# Patient Record
Sex: Male | Born: 1950 | Race: White | Hispanic: No | Marital: Married | Smoking: Former smoker
Health system: Southern US, Community
[De-identification: ages and names within clinical notes are randomized; demographics above are authoritative.]

## PROBLEM LIST (undated history)

## (undated) DIAGNOSIS — C801 Malignant (primary) neoplasm, unspecified: Secondary | ICD-10-CM

## (undated) DIAGNOSIS — E109 Type 1 diabetes mellitus without complications: Secondary | ICD-10-CM

## (undated) DIAGNOSIS — F32A Depression, unspecified: Secondary | ICD-10-CM

## (undated) DIAGNOSIS — E119 Type 2 diabetes mellitus without complications: Secondary | ICD-10-CM

## (undated) DIAGNOSIS — F1011 Alcohol abuse, in remission: Secondary | ICD-10-CM

## (undated) DIAGNOSIS — F4310 Post-traumatic stress disorder, unspecified: Secondary | ICD-10-CM

## (undated) DIAGNOSIS — K55069 Acute infarction of intestine, part and extent unspecified: Secondary | ICD-10-CM

## (undated) DIAGNOSIS — F329 Major depressive disorder, single episode, unspecified: Secondary | ICD-10-CM

## (undated) DIAGNOSIS — F419 Anxiety disorder, unspecified: Secondary | ICD-10-CM

## (undated) HISTORY — PX: HERNIA REPAIR: SHX51

## (undated) HISTORY — PX: CHOLECYSTECTOMY: SHX55

## (undated) HISTORY — DX: Alcohol abuse, in remission: F10.11

## (undated) HISTORY — DX: Type 1 diabetes mellitus without complications: E10.9

## (undated) HISTORY — DX: Malignant (primary) neoplasm, unspecified: C80.1

## (undated) HISTORY — DX: Anxiety disorder, unspecified: F41.9

## (undated) HISTORY — PX: OTHER SURGICAL HISTORY: SHX169

## (undated) HISTORY — DX: Post-traumatic stress disorder, unspecified: F43.10

## (undated) HISTORY — DX: Depression, unspecified: F32.A

---
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
## 2017-01-11 DIAGNOSIS — E119 Type 2 diabetes mellitus without complications: Secondary | ICD-10-CM | POA: Insufficient documentation

## 2017-04-26 ENCOUNTER — Encounter (HOSPITAL_COMMUNITY): Payer: Self-pay | Admitting: Emergency Medicine

## 2017-04-26 ENCOUNTER — Emergency Department (HOSPITAL_COMMUNITY)
Admission: EM | Admit: 2017-04-26 | Discharge: 2017-04-26 | Disposition: A | Discharge status: Home / Self Care | Payer: Medicare PPO | Attending: Emergency Medicine | Admitting: Emergency Medicine

## 2017-04-26 ENCOUNTER — Other Ambulatory Visit: Payer: Self-pay

## 2017-04-26 DIAGNOSIS — E119 Type 2 diabetes mellitus without complications: Secondary | ICD-10-CM | POA: Diagnosis not present

## 2017-04-26 DIAGNOSIS — R791 Abnormal coagulation profile: Secondary | ICD-10-CM | POA: Insufficient documentation

## 2017-04-26 DIAGNOSIS — Z7901 Long term (current) use of anticoagulants: Secondary | ICD-10-CM | POA: Diagnosis not present

## 2017-04-26 DIAGNOSIS — Z79899 Other long term (current) drug therapy: Secondary | ICD-10-CM | POA: Insufficient documentation

## 2017-04-26 DIAGNOSIS — T45511A Poisoning by anticoagulants, accidental (unintentional), initial encounter: Secondary | ICD-10-CM

## 2017-04-26 DIAGNOSIS — F17210 Nicotine dependence, cigarettes, uncomplicated: Secondary | ICD-10-CM | POA: Diagnosis not present

## 2017-04-26 HISTORY — DX: Type 2 diabetes mellitus without complications: E11.9

## 2017-04-26 HISTORY — DX: Acute infarction of intestine, part and extent unspecified: K55.069

## 2017-04-26 LAB — BASIC METABOLIC PANEL
Anion gap: 13 (ref 5–15)
BUN: 9 mg/dL (ref 6–20)
CHLORIDE: 98 mmol/L — AB (ref 101–111)
CO2: 23 mmol/L (ref 22–32)
CREATININE: 0.87 mg/dL (ref 0.61–1.24)
Calcium: 9.8 mg/dL (ref 8.9–10.3)
GFR calc Af Amer: >60 mL/min (ref >60)
Glucose, Bld: 104 mg/dL — ABNORMAL HIGH (ref 65–99)
Potassium: 4.3 mmol/L (ref 3.5–5.1)
SODIUM: 134 mmol/L — AB (ref 135–145)

## 2017-04-26 LAB — CBC WITH DIFFERENTIAL/PLATELET
Basophils Absolute: 0 10*3/uL (ref 0.0–0.1)
Basophils Relative: 0 %
EOS ABS: 0.1 10*3/uL (ref 0.0–0.7)
Eosinophils Relative: 1 %
HCT: 35.5 % — ABNORMAL LOW (ref 39.0–52.0)
HEMOGLOBIN: 11.8 g/dL — AB (ref 13.0–17.0)
Lymphocytes Relative: 26 %
Lymphs Abs: 2.8 10*3/uL (ref 0.7–4.0)
MCH: 31.1 pg (ref 26.0–34.0)
MCHC: 33.2 g/dL (ref 30.0–36.0)
MCV: 93.4 fL (ref 78.0–100.0)
MONOS PCT: 7 %
Monocytes Absolute: 0.8 10*3/uL (ref 0.1–1.0)
NEUTROS PCT: 66 %
Neutro Abs: 7 10*3/uL (ref 1.7–7.7)
PLATELETS: 283 10*3/uL (ref 150–400)
RBC: 3.8 MIL/uL — ABNORMAL LOW (ref 4.22–5.81)
RDW: 13.9 % (ref 11.5–15.5)
WBC: 10.7 10*3/uL — ABNORMAL HIGH (ref 4.0–10.5)

## 2017-04-26 LAB — PROTIME-INR
INR: 9.97
PROTHROMBIN TIME: 79 s — AB (ref 11.4–15.2)

## 2017-04-26 LAB — POC OCCULT BLOOD, ED: FECAL OCCULT BLD: NEGATIVE

## 2017-04-26 NOTE — ED Triage Notes (Signed)
Patient states he is on warfarin and was called by his doctor's office stating his INR is 13.5.

## 2017-04-26 NOTE — ED Provider Notes (Addendum)
[facility] EMERGENCY DEPARTMENT Provider Note   CSN: [id number] Arrival date & time: 04/26/17  1300     History   Chief Complaint Chief Complaint  Patient presents with  . Abnormal Lab    HPI Grant Medina is a 67 y.o. male.  Told to come to the emergency department as he had routine INR drawn today and INR was 13.5.  He reports that he has been warfarin 10 mg daily for the past 1 week by accident.  10 mg tablets were delivered to his house.  He is supposed to be on 2-3 mg daily.  He is asymptomatic except for feeling "tired for the past few days" he denies pain anywhere denies lightheadedness denies blood per rectum or black stools.  No other associated symptoms  HPI  Past Medical History:  Diagnosis Date  . Diabetes mellitus without complication ([place])   . 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   Past Surgical History:  Procedure Laterality Date  . HERNIA REPAIR    . reconstructed bile duct         Home Medications    Prior to Admission medications   Medication Sig Start Date End Date Taking? Authorizing Provider  acetaminophen (TYLENOL) 500 MG tablet  01/21/17   [provider]  ALPRAZolam Duanne Moron) 0.5 MG tablet  03/30/17   [provider]  baclofen (LIORESAL) 10 MG tablet  02/01/17   [provider]  lisinopril (PRINIVIL,ZESTRIL) 10 MG tablet  04/23/17   [provider]  metFORMIN (GLUCOPHAGE) 1000 MG tablet  04/23/17   [provider]  Multiple Vitamins-Minerals (CENTRUM ADULTS PO)  01/21/17   [provider]  polyethylene glycol powder (GLYCOLAX/MIRALAX) powder  04/03/17   [provider]  tamsulosin (FLOMAX) 0.4 MG CAPS capsule  04/03/17   [provider]  warfarin (COUMADIN) 10 MG tablet  04/19/17   [provider]  warfarin (COUMADIN) 2 MG tablet  03/27/17   [provider]  warfarin (COUMADIN) 3 MG tablet  02/27/17    [provider]  zolpidem (AMBIEN) 10 MG tablet  03/30/17   [provider]    Family History No family history on file.  Social History Social History   Tobacco Use  . Smoking status: Current Every Day Smoker    Types: E-cigarettes  . Smokeless tobacco: Never Used  Substance Use Topics  . Alcohol use: No    Frequency: Never  . Drug use: Not on file     Allergies   Patient has no known allergies.   Review of Systems Review of Systems  Constitutional: Positive for fatigue.  HENT: Negative.   Respiratory: Negative.   Cardiovascular: Positive for chest pain.       Syncope  Gastrointestinal: Negative.   Musculoskeletal: Negative.   Skin: Negative.   Allergic/Immunologic: Negative.   Neurological: Negative.   Hematological: Bruises/bleeds easily.  Psychiatric/Behavioral: Negative.   All other systems reviewed and are negative.    Physical Exam Updated Vital Signs BP (!) 140/92 (BP Location: Right Arm)   Pulse (!) 104   Temp 98.5 F (36.9 C) (Oral)   Resp 20   Ht 5\' 7"  (1.702 m)   Wt 74.8 kg (165 lb)   SpO2 100%   BMI 25.84 kg/m   Physical Exam  Constitutional: He appears well-developed and well-nourished.  HENT:  Head: Normocephalic and atraumatic.  Eyes: Conjunctivae are normal. Pupils are equal,  round, and reactive to light.  Neck: Neck supple. No tracheal deviation present. No thyromegaly present.  Cardiovascular: Normal rate and regular rhythm.  No murmur heard. Pulse counted at 68 bpm by me  Pulmonary/Chest: Effort normal and breath sounds normal.  Abdominal: Soft. Bowel sounds are normal. He exhibits no distension. There is no tenderness.  Genitourinary: Rectum normal. Rectal exam shows guaiac negative stool.  Genitourinary Comments: Normal tone brown stool nontender no gross blood  Musculoskeletal: Normal range of motion. He exhibits no edema or tenderness.  Neurological: He is alert. Coordination normal.  Skin: Skin is warm  and dry. Capillary refill takes less than 2 seconds. No rash noted.  Psychiatric: He has a normal mood and affect.  Nursing note and vitals reviewed.    ED Treatments / Results  Labs (all labs ordered are listed, but only abnormal results are displayed) Labs Reviewed  CBC WITH DIFFERENTIAL/PLATELET - Abnormal; Notable for the following components:      Result Value   WBC 10.7 (*)    RBC 3.80 (*)    Hemoglobin 11.8 (*)    HCT 35.5 (*)    All other components within normal limits  PROTIME-INR  BASIC METABOLIC PANEL  POC OCCULT BLOOD, ED   Results for orders placed or performed during the hospital encounter of 04/26/17  Protime-INR  Result Value Ref Range   Prothrombin Time 79.0 (H) 11.4 - 15.2 seconds   INR 9.97 (HH)   CBC with Differential  Result Value Ref Range   WBC 10.7 (H) 4.0 - 10.5 K/uL   RBC 3.80 (L) 4.22 - 5.81 MIL/uL   Hemoglobin 11.8 (L) 13.0 - 17.0 g/dL   HCT 35.5 (L) 39.0 - 52.0 %   MCV 93.4 78.0 - 100.0 fL   MCH 31.1 26.0 - 34.0 pg   MCHC 33.2 30.0 - 36.0 g/dL   RDW 13.9 11.5 - 15.5 %   Platelets 283 150 - 400 K/uL   Neutrophils Relative % 66 %   Neutro Abs 7.0 1.7 - 7.7 K/uL   Lymphocytes Relative 26 %   Lymphs Abs 2.8 0.7 - 4.0 K/uL   Monocytes Relative 7 %   Monocytes Absolute 0.8 0.1 - 1.0 K/uL   Eosinophils Relative 1 %   Eosinophils Absolute 0.1 0.0 - 0.7 K/uL   Basophils Relative 0 %   Basophils Absolute 0.0 0.0 - 0.1 K/uL  Basic metabolic panel  Result Value Ref Range   Sodium 134 (L) 135 - 145 mmol/L   Potassium 4.3 3.5 - 5.1 mmol/L   Chloride 98 (L) 101 - 111 mmol/L   CO2 23 22 - 32 mmol/L   Glucose, Bld 104 (H) 65 - 99 mg/dL   BUN 9 6 - 20 mg/dL   Creatinine, Ser 0.87 0.61 - 1.24 mg/dL   Calcium 9.8 8.9 - 10.3 mg/dL   GFR calc non Af Amer >60 >60 mL/min   GFR calc Af Amer >60 >60 mL/min   Anion gap 13 5 - 15  POC occult blood, ED  Result Value Ref Range   Fecal Occult Bld NEGATIVE NEGATIVE   No results found. EKG  EKG  Interpretation None       Radiology No results found.  Procedures Procedures (including critical care time)  Medications Ordered in ED Medications - No data to display   Initial Impression / Assessment and Plan / ED Course  I have reviewed the triage vital signs and the nursing notes.  Pertinent labs & imaging  results that were available during my care of the patient were reviewed by me and considered in my medical decision making (see chart for details).     3 35 p.m. patient resting comfortably.  No distress.  Case discussed with hospital pharmacist.  No need for further treatment.  Withhold Coumadin for 2 days.  INR recheck 2 days. She reports that he will be able to get his INR rechecked at the [facility] Final Clinical Impressions(s) / ED Diagnoses  Diagnosis warfarin toxicity Final diagnoses:  None    ED Discharge Orders    None       Orlie Dakin, MD 04/26/17 1541    Orlie Dakin, MD 04/26/17 1547

## 2017-04-26 NOTE — Discharge Instructions (Signed)
Your INR today is 9.97.  Do not take any Coumadin (warfarin) today or tomorrow.  Get your INR rechecked again in 2 days and ask your doctor after you get the result when to restart Coumadin.  Return if you develop headache blood in stool black stools or lightheadedness or if your condition worsens for any reason.avoid  falls.

## 2017-04-26 NOTE — ED Notes (Addendum)
CRITICAL VALUE ALERT  Critical Value:  PT 79/INR 9.97  Date & Time Notied:  04/26/17 1520  Provider Notified: dr Carney Bern  Orders Received/Actions taken:

## 2018-03-30 ENCOUNTER — Encounter (HOSPITAL_COMMUNITY): Payer: Self-pay | Admitting: Emergency Medicine

## 2018-03-30 ENCOUNTER — Emergency Department (HOSPITAL_COMMUNITY)
Admission: EM | Admit: 2018-03-30 | Discharge: 2018-03-31 | Disposition: A | Discharge status: Home / Self Care | Payer: Medicare PPO | Attending: Emergency Medicine | Admitting: Emergency Medicine

## 2018-03-30 ENCOUNTER — Other Ambulatory Visit: Payer: Self-pay

## 2018-03-30 DIAGNOSIS — M545 Low back pain, unspecified: Secondary | ICD-10-CM

## 2018-03-30 DIAGNOSIS — M5441 Lumbago with sciatica, right side: Secondary | ICD-10-CM | POA: Diagnosis not present

## 2018-03-30 DIAGNOSIS — M5431 Sciatica, right side: Secondary | ICD-10-CM

## 2018-03-30 DIAGNOSIS — Z79899 Other long term (current) drug therapy: Secondary | ICD-10-CM | POA: Insufficient documentation

## 2018-03-30 DIAGNOSIS — F17290 Nicotine dependence, other tobacco product, uncomplicated: Secondary | ICD-10-CM | POA: Diagnosis not present

## 2018-03-30 DIAGNOSIS — E119 Type 2 diabetes mellitus without complications: Secondary | ICD-10-CM | POA: Diagnosis not present

## 2018-03-30 DIAGNOSIS — Z794 Long term (current) use of insulin: Secondary | ICD-10-CM | POA: Diagnosis not present

## 2018-03-30 MED ORDER — HYDROCODONE-ACETAMINOPHEN 5-325 MG PO TABS
2.0000 | ORAL_TABLET | Freq: Once | ORAL | Status: AC
  Administered 2018-03-31: 2 via ORAL
  Filled 2018-03-30: qty 2

## 2018-03-30 NOTE — ED Triage Notes (Signed)
Pt c/o pain to right hip/buttock area that radiates down his right thigh since yesterday, denies injury

## 2018-03-31 ENCOUNTER — Emergency Department (HOSPITAL_COMMUNITY): Payer: Medicare PPO

## 2018-03-31 MED ORDER — METHOCARBAMOL 500 MG PO TABS: 500.0000 mg | ORAL_TABLET | Freq: Two times a day (BID) | ORAL | 0 refills | Status: AC

## 2018-03-31 MED ORDER — HYDROCODONE-ACETAMINOPHEN 5-325 MG PO TABS: 1.0000 | ORAL_TABLET | ORAL | 0 refills | Status: DC | PRN

## 2018-03-31 NOTE — ED Provider Notes (Signed)
[facility] EMERGENCY DEPARTMENT Provider Note   CSN: [id number] Arrival date & time: 03/30/18  2253     History   Chief Complaint Chief Complaint  Patient presents with  . Back Pain    HPI Grant Medina is a 67 y.o. male.  The history is provided by the patient. No language interpreter was used.  Back Pain   This is a new problem. The current episode started 2 days ago. The problem occurs constantly. The problem has been gradually worsening. The pain is associated with no known injury. The pain is moderate. The pain is worse during the day. He has tried nothing for the symptoms. The treatment provided no relief.   Pt complains of pain in his right low back/buttock area that shots down his right leg.  Pt reports he put a neighbors pain patch on the area.  Past Medical History:  Diagnosis Date  . Diabetes mellitus without complication ([place])   . 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   Past Surgical History:  Procedure Laterality Date  . HERNIA REPAIR    . reconstructed bile duct          Home Medications    Prior to Admission medications   Medication Sig Start Date End Date Taking? Authorizing Provider  acetaminophen (TYLENOL) 500 MG tablet Take 500 mg by mouth every 4 (four) hours as needed.  01/21/17   [provider]  ALPRAZolam Duanne Moron) 1 MG tablet Take 1 mg by mouth 3 (three) times daily.  03/30/17   [provider]  B Complex-Biotin-FA (B-COMPLEX PO) Take 1 tablet by mouth daily.    [provider]  FLUoxetine (PROZAC) 10 MG capsule Take 10 mg by mouth daily.    [provider]  gabapentin (NEURONTIN) 600 MG tablet Take 600 mg by mouth 3 (three) times daily.    [provider]  Insulin Degludec (TRESIBA FLEXTOUCH [place]) Inject 16 Units into the skin daily. 16 units daily, every 3rd day 20 units    [provider]  lipase/protease/amylase (CREON) 12000 units  CPEP capsule Take 36,000 Units by mouth 3 (three) times daily before meals.    [provider]  lisinopril (PRINIVIL,ZESTRIL) 10 MG tablet Take 10 mg by mouth daily.  04/23/17   [provider]  metFORMIN (GLUCOPHAGE) 1000 MG tablet Take 1,000 mg by mouth 2 (two) times daily with a meal.  04/23/17   [provider]  mirtazapine (REMERON) 15 MG tablet Take 15 mg by mouth at bedtime.    [provider]  Multiple Vitamin (DAILY VITE PO) Take 1 tablet by mouth daily.    [provider]  Multiple Vitamins-Minerals (CENTRUM ADULTS PO)  01/21/17   [provider]  naltrexone (DEPADE) 50 MG tablet Take 50 mg by mouth daily.    [provider]  polyethylene glycol powder (GLYCOLAX/MIRALAX) powder  04/03/17   [provider]  warfarin (COUMADIN) 10 MG tablet Take 10 mg by mouth one time only at 6 PM. Take 2mg  tablet on all days except for Thursday, Saturday, Sunday take 3mg  tablet. 04/19/17   [provider]  warfarin (COUMADIN) 2 MG tablet  03/27/17   [provider]  warfarin (COUMADIN) 3 MG tablet  02/27/17   [provider]  zolpidem (AMBIEN) 10 MG tablet Take 10 mg by mouth at bedtime.  03/30/17   [provider]    Family History  History reviewed. No pertinent family history.  Social History Social History   Tobacco Use  . Smoking status: Current Every Day Smoker    Types: E-cigarettes  . Smokeless tobacco: Never Used  Substance Use Topics  . Alcohol use: No    Frequency: Never  . Drug use: Never     Allergies   Patient has no known allergies.   Review of Systems Review of Systems  Musculoskeletal: Positive for back pain.  All other systems reviewed and are negative.    Physical Exam Updated Vital Signs BP 138/79 (BP Location: Left Arm)   Pulse 61   Temp 97.9 F (36.6 C) (Oral)   Resp 16   Ht 5\' 7"  (1.702 m)   Wt 77.1 kg   SpO2 98%   BMI 26.63 kg/m   Physical Exam   Constitutional: He appears well-developed and well-nourished.  Cardiovascular: Normal rate.  Pulmonary/Chest: Effort normal.  Musculoskeletal: Normal range of motion.  Tender sciatic area no rash   Neurological: He is alert.  Skin: Skin is warm.  Nursing note and vitals reviewed.    ED Treatments / Results  Labs (all labs ordered are listed, but only abnormal results are displayed) Labs Reviewed - No data to display  EKG None  Radiology No results found.  Procedures Procedures (including critical care time)  Medications Ordered in ED Medications  HYDROcodone-acetaminophen (NORCO/VICODIN) 5-325 MG per tablet 2 tablet (2 tablets Oral Given 03/31/18 0011)     Initial Impression / Assessment and Plan / ED Course  I have reviewed the triage vital signs and the nursing notes.  Pertinent labs & imaging results that were available during my care of the patient were reviewed by me and considered in my medical decision making (see chart for details).     MDM  Pain is in sciatic area and goes down leg.  No abdominal pain, no mass.  LS spine no acute abnormality.  Pt given hydrocodone 2 tablets.  I advised pt to look for rash that could indicate shingles.  Pt given rx for robaxin and hydrocodone   Final Clinical Impressions(s) / ED Diagnoses   Final diagnoses:  Sciatica of right side  Acute low back pain, unspecified back pain laterality, unspecified whether sciatica present    ED Discharge Orders         Ordered    HYDROcodone-acetaminophen (NORCO/VICODIN) 5-325 MG tablet  Every 4 hours PRN     03/31/18 0103    methocarbamol (ROBAXIN) 500 MG tablet  2 times daily     03/31/18 0103        An After Visit Summary was printed and given to the patient.    Fransico Meadow, PA-C 03/31/18 Kerrin Champagne    Rolland Porter, MD 03/31/18 [phone]

## 2018-03-31 NOTE — Discharge Instructions (Addendum)
Return if any problems.

## 2018-10-30 ENCOUNTER — Ambulatory Visit: Payer: Medicare PPO | Admitting: Physician Assistant

## 2018-10-31 ENCOUNTER — Other Ambulatory Visit: Payer: Self-pay

## 2018-10-31 ENCOUNTER — Ambulatory Visit (INDEPENDENT_AMBULATORY_CARE_PROVIDER_SITE_OTHER): Payer: Medicare PPO | Admitting: Physician Assistant

## 2018-10-31 ENCOUNTER — Encounter: Payer: Self-pay | Admitting: Physician Assistant

## 2018-10-31 VITALS — BP 141/73 | HR 98 | Ht 67.0 in | Wt 169.0 lb

## 2018-10-31 DIAGNOSIS — F411 Generalized anxiety disorder: Secondary | ICD-10-CM | POA: Diagnosis not present

## 2018-10-31 DIAGNOSIS — F331 Major depressive disorder, recurrent, moderate: Secondary | ICD-10-CM

## 2018-10-31 DIAGNOSIS — G4700 Insomnia, unspecified: Secondary | ICD-10-CM | POA: Diagnosis not present

## 2018-10-31 DIAGNOSIS — F4310 Post-traumatic stress disorder, unspecified: Secondary | ICD-10-CM | POA: Diagnosis not present

## 2018-10-31 MED ORDER — TRAZODONE HCL 50 MG PO TABS: 25.0000 mg | ORAL_TABLET | Freq: Every evening | ORAL | 1 refills | Status: DC | PRN

## 2018-10-31 MED ORDER — FLUOXETINE HCL 40 MG PO CAPS: 40.0000 mg | ORAL_CAPSULE | Freq: Every day | ORAL | 1 refills | Status: DC

## 2018-10-31 MED ORDER — ALPRAZOLAM 0.5 MG PO TABS: 0.2500 mg | ORAL_TABLET | Freq: Two times a day (BID) | ORAL | 1 refills | Status: DC | PRN
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
## 2018-11-01 ENCOUNTER — Encounter: Payer: Self-pay | Admitting: Physician Assistant

## 2018-11-13 ENCOUNTER — Other Ambulatory Visit: Payer: Self-pay

## 2018-11-13 ENCOUNTER — Ambulatory Visit (INDEPENDENT_AMBULATORY_CARE_PROVIDER_SITE_OTHER): Payer: Medicare PPO | Admitting: Psychiatry

## 2018-11-13 DIAGNOSIS — F4310 Post-traumatic stress disorder, unspecified: Secondary | ICD-10-CM | POA: Diagnosis not present

## 2018-11-13 DIAGNOSIS — F1011 Alcohol abuse, in remission: Secondary | ICD-10-CM

## 2018-11-13 DIAGNOSIS — F411 Generalized anxiety disorder: Secondary | ICD-10-CM | POA: Diagnosis not present

## 2018-11-13 DIAGNOSIS — Z87898 Personal history of other specified conditions: Secondary | ICD-10-CM

## 2018-11-13 DIAGNOSIS — G4700 Insomnia, unspecified: Secondary | ICD-10-CM | POA: Diagnosis not present
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
## 2018-11-22 ENCOUNTER — Ambulatory Visit (INDEPENDENT_AMBULATORY_CARE_PROVIDER_SITE_OTHER): Payer: Medicare PPO | Admitting: Psychiatry

## 2018-11-22 ENCOUNTER — Other Ambulatory Visit: Payer: Self-pay

## 2018-11-22 DIAGNOSIS — F411 Generalized anxiety disorder: Secondary | ICD-10-CM | POA: Diagnosis not present

## 2018-11-22 DIAGNOSIS — F1011 Alcohol abuse, in remission: Secondary | ICD-10-CM | POA: Diagnosis not present

## 2018-11-22 DIAGNOSIS — Z87898 Personal history of other specified conditions: Secondary | ICD-10-CM

## 2018-11-22 DIAGNOSIS — G4700 Insomnia, unspecified: Secondary | ICD-10-CM | POA: Diagnosis not present

## 2018-11-22 NOTE — Progress Notes (Signed)
Psychotherapy Progress Note [facility] [name], PhD LP  Patient ID: Grant Medina     MRN: [id number]     Therapy format: Family therapy w/ patient -- accompanied by wife [name] Date: 11/22/2018     Start: 3:14p Stop: 4:03p Time Spent: 49 min Location: in-person   Session narrative (presenting needs, interim history, self-report of stressors and symptoms, applications of prior therapy, status changes, and interventions made in session) Feeling better now that move is done, not much irritability.  Back into a more comfortable space after 2 years in a mill house near [place], often bothered by noise.  Trazodone only getting 4-5 hours sleep before waking.  Remeron may be stimulating evening carb loading.  Intended bedtime about 8pm, rises 5-5:30am.  History of long shifts and very early rising (e.g., 1:30am).  Has not ordered amber glasses.  Refreshed rationale and likely benefit.  Taught diaphragmatic breathing with imagery (deflating the air mattress, blowing away smoke), to good effect and oriented to practice and common obstacles.  Reports widespread arthritis, aggravated by moving a couple weeks ago, helped with steroid shot and antiinflammatory therapy.  Advised going forward that he might pre-medicate with OTC antiinflammatory to prevent flareups, subject to physician's judgment..  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  Therapeutic modalities: Cognitive Behavioral Therapy  Mental Status/Observations:  Appearance:   Casual     Behavior:  Appropriate  Motor:  Normal  Speech/Language:   Clear and Coherent  Affect:  Appropriate  Mood:  less irritable  Thought process:  normal  Thought content:    WNL  Sensory/Perceptual disturbances:    WNL  Orientation:  grossly intact  Attention:  Good  Concentration:  Good  Memory:  WNL  Insight:    Fair   Judgment:   Good  Impulse Control:  Good   Risk Assessment: Danger to Self: No Self-injurious Behavior: No Danger to Others: No Physical Aggression / Violence: No Duty to Warn: No Access to Firearms a concern: No  Assessment of progress:  progressing  Diagnosis:   ICD-10-CM   1. Insomnia, unspecified type  G47.00   2. Generalized anxiety disorder  F41.1   3. 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 Option to move bedtime to 9pm for a better fit to the night . Go ahead and order amber glasses -- use for at least an hour before bed . Practice breathing technique PRN for self-soothing, tension release . Other recommendations/advice as noted above . Continue to utilize previously learned skills ad lib . Maintain medication as prescribed and work faithfully with relevant prescriber(s) if any changes are desired or seem indicated . Call the clinic on-call service, present to ER, or call 911 if any life-threatening psychiatric crisis Return in about 2 weeks (around 12/06/2018).  Blanchie Serve, PhD [name], PhD LP Clinical Psychologist, [facility] [address] [place], [place] [address] [phone]

## 2018-12-12 ENCOUNTER — Encounter: Payer: Self-pay | Admitting: Physician Assistant

## 2018-12-12 ENCOUNTER — Ambulatory Visit (INDEPENDENT_AMBULATORY_CARE_PROVIDER_SITE_OTHER): Payer: Medicare PPO | Admitting: Physician Assistant

## 2018-12-12 ENCOUNTER — Other Ambulatory Visit: Payer: Self-pay

## 2018-12-12 ENCOUNTER — Ambulatory Visit (INDEPENDENT_AMBULATORY_CARE_PROVIDER_SITE_OTHER): Payer: Medicare PPO | Admitting: Psychiatry

## 2018-12-12 DIAGNOSIS — F331 Major depressive disorder, recurrent, moderate: Secondary | ICD-10-CM | POA: Diagnosis not present

## 2018-12-12 DIAGNOSIS — G4700 Insomnia, unspecified: Secondary | ICD-10-CM | POA: Diagnosis not present

## 2018-12-12 DIAGNOSIS — F3341 Major depressive disorder, recurrent, in partial remission: Secondary | ICD-10-CM

## 2018-12-12 DIAGNOSIS — F411 Generalized anxiety disorder: Secondary | ICD-10-CM | POA: Diagnosis not present

## 2018-12-12 DIAGNOSIS — F4310 Post-traumatic stress disorder, unspecified: Secondary | ICD-10-CM

## 2018-12-12 DIAGNOSIS — Z87898 Personal history of other specified conditions: Secondary | ICD-10-CM

## 2018-12-12 DIAGNOSIS — F1011 Alcohol abuse, in remission: Secondary | ICD-10-CM

## 2018-12-12 MED ORDER — ALPRAZOLAM 0.5 MG PO TABS: 0.2500 mg | ORAL_TABLET | Freq: Two times a day (BID) | ORAL | 2 refills | Status: DC | PRN

## 2018-12-12 MED ORDER — TRAZODONE HCL 50 MG PO TABS: 25.0000 mg | ORAL_TABLET | Freq: Every evening | ORAL | 0 refills | Status: DC | PRN

## 2018-12-12 NOTE — Progress Notes (Signed)
[facility]  Patient ID: Grant Medina,  MRN: [id number]  PCP: Esperanza Sheets, FNP  Date of Evaluation: 12/12/2018 Time spent:15 minutes  Chief Complaint:  Chief Complaint    Follow-up     Virtual Visit via Telephone Note  I connected with patient by a video enabled telemedicine application or telephone, with their informed consent, and verified patient privacy and that I am speaking with the correct person using two identifiers.  I am private, in my home and the patient is home.  I discussed the limitations, risks, security and privacy concerns of performing an evaluation and management service by telephone and the availability of in person appointments. I also discussed with the patient that there may be a patient responsible charge related to this service. The patient expressed understanding and agreed to proceed.   I discussed the assessment and treatment plan with the patient. The patient was provided an opportunity to ask questions and all were answered. The patient agreed with the plan and demonstrated an understanding of the instructions.   The patient was advised to call back or seek an in-person evaluation if the symptoms worsen or if the condition fails to improve as anticipated.  I provided 15  minutes of non-face-to-face time during this encounter.  HISTORY/CURRENT STATUS: HPI for routine med check.  He was last seen 10/31/2018.  At that time we increased the Prozac to 40 mg and restarted Xanax as needed and started trazodone.  He states that he is doing much better overall.  He sleeps well with the trazodone.  He usually only takes 1 pill but sometimes has needed two.  He is also using Amber colored glasses which helps relax him so that he can ready himself for sleep.  Anxiety is much better.  He does take the Xanax which is helpful.  Increasing the Prozac has also been helpful.  Patient denies loss of interest in usual activities and is able to enjoy  things.  Denies decreased energy or motivation.  Appetite has not changed.  No extreme sadness, tearfulness, or feelings of hopelessness.  Denies any changes in concentration, making decisions or remembering things.  Denies suicidal or homicidal thoughts.  Denies dizziness, syncope, seizures, numbness, tingling, tremor, tics, unsteady gait, slurred speech, confusion. Denies muscle or joint pain, stiffness, or dystonia.  Individual Medical History/ Review of Systems: Changes? :Yes He was having extreme fatigue associated with nausea not long ago.  He was tested for COVID which was negative.  He found out his blood sugars were elevated and Actos was added.  He has felt a lot better since his blood sugars are better controlled now.  Past medications for mental health diagnoses include: Xanax, Prozac, Ambien, Effexor, Wellbutrin  Allergies: Patient has no known allergies.  Current Medications:  Current Outpatient Medications:  .  acetaminophen (TYLENOL) 500 MG tablet, Take 500 mg by mouth every 4 (four) hours as needed. , Disp: , Rfl:  .  ALPRAZolam (XANAX) 0.5 MG tablet, Take 0.5-1 tablets (0.25-0.5 mg total) by mouth 2 (two) times daily as needed for anxiety., Disp: 60 tablet, Rfl: 2 .  B Complex-Biotin-FA (B-COMPLEX PO), Take 1 tablet by mouth daily., Disp: , Rfl:  .  FLUoxetine (PROZAC) 40 MG capsule, Take 1 capsule (40 mg total) by mouth daily., Disp: 30 capsule, Rfl: 1 .  gabapentin (NEURONTIN) 600 MG tablet, Take 600 mg by mouth 3 (three) times daily., Disp: , Rfl:  .  Insulin Degludec (TRESIBA FLEXTOUCH ), Inject 16  Units into the skin daily. 16 units daily, every 3rd day 20 units, Disp: , Rfl:  .  lipase/protease/amylase (CREON) 12000 units CPEP capsule, Take 36,000 Units by mouth 3 (three) times daily before meals., Disp: , Rfl:  .  lisinopril (PRINIVIL,ZESTRIL) 10 MG tablet, Take 10 mg by mouth daily. , Disp: , Rfl:  .  metFORMIN (GLUCOPHAGE) 1000 MG tablet, Take 1,000 mg by mouth 2  (two) times daily with a meal. , Disp: , Rfl:  .  mirtazapine (REMERON) 15 MG tablet, Take 15 mg by mouth at bedtime., Disp: , Rfl:  .  Multiple Vitamin (DAILY VITE PO), Take 1 tablet by mouth daily., Disp: , Rfl:  .  Multiple Vitamins-Minerals (CENTRUM ADULTS PO), , Disp: , Rfl:  .  naltrexone (DEPADE) 50 MG tablet, Take 50 mg by mouth daily., Disp: , Rfl:  .  polyethylene glycol powder (GLYCOLAX/MIRALAX) powder, , Disp: , Rfl:  .  rivaroxaban (XARELTO) 20 MG TABS tablet, Take by mouth., Disp: , Rfl:  .  traZODone (DESYREL) 50 MG tablet, Take 0.5-2 tablets (25-100 mg total) by mouth at bedtime as needed for sleep., Disp: 180 tablet, Rfl: 0 .  HYDROcodone-acetaminophen (NORCO/VICODIN) 5-325 MG tablet, Take 1 tablet by mouth every 4 (four) hours as needed. (Patient not taking: Reported on 10/31/2018), Disp: 10 tablet, Rfl: 0 .  methocarbamol (ROBAXIN) 500 MG tablet, Take 1 tablet (500 mg total) by mouth 2 (two) times daily. (Patient not taking: Reported on 10/31/2018), Disp: 20 tablet, Rfl: 0 .  ondansetron (ZOFRAN) 4 MG tablet, TK 1 T PO TID FOR 10 DAYS, Disp: , Rfl:  .  pioglitazone (ACTOS) 15 MG tablet, TK 1 T PO QD, Disp: , Rfl:  .  tamsulosin (FLOMAX) 0.4 MG CAPS capsule, , Disp: , Rfl:  .  warfarin (COUMADIN) 10 MG tablet, Take 10 mg by mouth one time only at 6 PM. Take 2mg  tablet on all days except for Thursday, Saturday, Sunday take 3mg  tablet., Disp: , Rfl:  .  warfarin (COUMADIN) 2 MG tablet, , Disp: , Rfl:  .  warfarin (COUMADIN) 3 MG tablet, , Disp: , Rfl:  Medication Side Effects: none  Family Medical/ Social History: Changes? No  MENTAL HEALTH EXAM:  There were no vitals taken for this visit.There is no height or weight on file to calculate BMI.  General Appearance: unable to assess  Eye Contact:  unable toassess  Speech:  Clear and Coherent  Volume:  Normal  Mood:  Euthymic  Affect:  Unable to assess  Thought Process:  Goal Directed  Orientation:  Full (Time, Place, and  Person)  Thought Content: Logical   Suicidal Thoughts:  No  Homicidal Thoughts:  No  Memory:  WNL  Judgement:  Good  Insight:  Good  Psychomotor Activity:  Unable to assess  Concentration:  Concentration: Good  Recall:  Good  Fund of Knowledge: Good  Language: Good  Assets:  Desire for Improvement  ADL's:  Intact  Cognition: WNL  Prognosis:  Good    DIAGNOSES:    ICD-10-CM   1. PTSD (post-traumatic stress disorder)  F43.10   2. Major depressive disorder, recurrent episode, moderate (HCC)  F33.1   3. Generalized anxiety disorder  F41.1   4. Insomnia, unspecified type  G47.00     Receiving Psychotherapy: Yes With Dr. Jonni Sanger Mitchum   RECOMMENDATIONS:  I am glad to see him doing so well! PDMP was reviewed. Continue Xanax 0.5 mg 1/2-1 p.o. twice daily as needed. Continue Prozac  40 mg daily. Continue gabapentin 600 mg 1 3 times daily. Continue mirtazapine 15 mg nightly. Continue trazodone 50 mg, 1-2 nightly as needed sleep. Continue psychotherapy with Dr. [name]. Return in 3 months.  Donnal Moat, PA-C   This record has been created using [employer].  Chart creation errors have been sought, but may not always have been located and corrected. Such creation errors do not reflect on the standard of medical care.

## 2018-12-12 NOTE — Progress Notes (Signed)
Psychotherapy Progress Note [facility] [name], PhD LP  Patient ID: Grant Medina     MRN: [id number]     Therapy format: Individual psychotherapy Date: 12/12/2018     Start: 3:25p Stop: 4:05p Time Spent: 40 min Location: telehealth   Telehealth visit -- I connected with this patient by an approved telecommunication method (audio only), with his informed consent, and verifying identity and patient privacy.  I was located at my office and patient at his home.  As needed, we discussed the limitations, risks, and security and privacy concerns associated with telehealth service, including the availability and conditions which currently govern in-person appointments and the possibility that 3rd-party payment may not be fully guaranteed and he may be responsible for charges.  After he indicated understanding, we proceeded with the session.  Also discussed treatment planning, as needed, including ongoing verbal agreement with the plan, the opportunity to ask and answer all questions, his demonstrated understanding of instructions, and his readiness to call the office should symptoms worsen or he feels he is in a crisis state and needs more immediate and tangible assistance.  Session narrative (presenting needs, interim history, self-report of stressors and symptoms, applications of prior therapy, status changes, and interventions made in session) PT requested telehealth today, had to go for a COVID test after he was nauseous 4 days and had some suspicious symptoms.  Awaiting results of test from yesterday, but b.s. was out of control, turned out he was not watching diet correctly and lost one of his medicines.  B.s. levelling in 140s now.  Right now, largely trying to tend the home.  Relaxing and experiencing less stress.  A lot helped just by getting settled in the house.  Arthritis calmed down a lot with using Tylenol before exerting himself.  Says the amber glasses are working great  -- 30 minutes before bed, and quiet conditions have him in mood to go to sleep easily round 9pm, staying asleep till 5:30pm-6:30pm without significant interruption.  Wife sees the benefit, too.  Grateful to her for noticing his condition and getting him to see the doctor.    Re. OCD -- feels less stringent about order and evenness.    Re. Generalized anxiety -- settled much more as aspects of the move have settled.  Positive self-talk about lowering expectations, having time to complete things, don't have to do it all at once, etc.  Uses the breathing technique many days, finds himself beginning to get the hang of diaphragmatic breathing,  Self-estimated 30-40% competency at the breathing technique.  Trying to make the time, especially in the morning, to breathe easy, take in the sunrise.  Discussed ways of extending the benefit for daylong stress reduction -- advised to adopt a body check when checking the time or preparing to eat and to apply diaphragmatic breathing PRN.  For habit-building, recommended imaginary practice looking at clock then turning attention to body, and letting wife know it's a habit-building project, allowing her to prompt/ask after it.  Agrees.  Re. PTSD, no NMs, physio arousal, reliving, no avoiding of reminders.    Saw Ms. Hurst earlier today, stable medication.  Using just 1 trazodone.    Now has stronger diabetic med Tyler Aas and Metformin).    Dicussed followup strategy -- would like a 59-month check-in, available earlier PRN.  Will discuss with wife [name] to see if he's missing anything noticeable/important.     Therapeutic modalities: Cognitive Behavioral Therapy   Mental Status/Observations:  Appearance:   Not assessed     Behavior:  Appropriate  Motor:  Not assessed  Speech/Language:   Clear and Coherent  Affect:  Not assessed  Mood:  euthymic  Thought process:  normal  Thought content:    WNL  Sensory/Perceptual disturbances:    WNL; hearing deficit    Orientation:  grossly intact  Attention:  Good  Concentration:  Good  Memory:  WNL  Insight:    Good  Judgment:   Good  Impulse Control:  Good   Risk Assessment: Danger to Self: No Self-injurious Behavior: No Danger to Others: No Physical Aggression / Violence: No Duty to Warn: No Access to Firearms a concern: No  Assessment of progress:  progressing well  Diagnosis:   ICD-10-CM   1. Recurrent major depressive disorder, in partial remission ([place])  F33.41   2. Generalized anxiety disorder  F41.1    much improved  3. Insomnia, unspecified type  G47.00    much-improved, in near remission  4. PTSD (post-traumatic stress disorder)  F43.10    in remission  5. History of alcohol abuse  F10.11    on naltrexone maintenance, ILTR  6. 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 Continue breathing practice, adding body checks when checking the time or preparing to eat . Cont use of blue-filtering glasses . Follow physical recommendations for diabetic care . Discuss with wife current call for therapy vs. OK to go on PRN or longterm followup.  Made aware of known and expected conditions for telehealth if preferred. Marland Kitchen Options to work more CBT for alcohol relapse prevention, processing traumatic memories, habit-building for self-care with medical conditions, or release from sleep medication . Other recommendations/advice as noted above . Continue to utilize previously learned skills ad lib . Maintain medication as prescribed and work faithfully with relevant prescriber(s) if any changes are desired or seem indicated . Call the clinic on-call service, present to ER, or call 911 if any life-threatening psychiatric crisis Return in about 3 months (around 03/14/2019).  Blanchie Serve, PhD [name], PhD LP Clinical Psychologist, [facility] [address] [place], [place] [address] [phone]

## 2018-12-19 ENCOUNTER — Other Ambulatory Visit: Payer: Self-pay

## 2018-12-19 MED ORDER — FLUOXETINE HCL 40 MG PO CAPS: 40.0000 mg | ORAL_CAPSULE | Freq: Every day | ORAL | 1 refills | Status: DC

## 2018-12-19 MED ORDER — TRAZODONE HCL 50 MG PO TABS: 25.0000 mg | ORAL_TABLET | Freq: Every evening | ORAL | 0 refills | Status: DC | PRN

## 2018-12-29 ENCOUNTER — Other Ambulatory Visit: Payer: Self-pay | Admitting: Physician Assistant

## 2019-01-09 ENCOUNTER — Ambulatory Visit (INDEPENDENT_AMBULATORY_CARE_PROVIDER_SITE_OTHER): Payer: Medicare PPO | Admitting: Psychiatry

## 2019-01-09 ENCOUNTER — Other Ambulatory Visit: Payer: Self-pay

## 2019-01-09 DIAGNOSIS — F411 Generalized anxiety disorder: Secondary | ICD-10-CM

## 2019-01-09 DIAGNOSIS — G4700 Insomnia, unspecified: Secondary | ICD-10-CM | POA: Diagnosis not present

## 2019-01-09 DIAGNOSIS — Z8659 Personal history of other mental and behavioral disorders: Secondary | ICD-10-CM

## 2019-01-09 DIAGNOSIS — F3342 Major depressive disorder, recurrent, in full remission: Secondary | ICD-10-CM | POA: Diagnosis not present

## 2019-01-09 NOTE — Progress Notes (Signed)
Psychotherapy Progress Note [facility] [name], PhD LP  Patient ID: WELSEY DIA     MRN: [id number]     Therapy format: Individual psychotherapy Date: 01/09/2019     Start: 1:15p Stop: 1:55p Time Spent: 40 min Location: telehealth   Telehealth visit -- I connected with this patient by an approved telecommunication method (audio only), with his informed consent, and verifying identity and patient privacy.  I was located at my office and patient at his home.  As needed, we discussed the limitations, risks, and security and privacy concerns associated with telehealth service, including the availability and conditions which currently govern in-person appointments and the possibility that 3rd-party payment may not be fully guaranteed and he may be responsible for charges.  After he indicated understanding, we proceeded with the session.  Also discussed treatment planning, as needed, including ongoing verbal agreement with the plan, the opportunity to ask and answer all questions, his demonstrated understanding of instructions, and his readiness to call the office should symptoms worsen or he feels he is in a crisis state and needs more immediate and tangible assistance.  Session narrative (presenting needs, interim history, self-report of stressors and symptoms, applications of prior therapy, status changes, and interventions made in session) Wife [name] requested he get an earlier session.  PT says he has no "great interest in going places", wife thinks it's "antisocial", but to him it is all about controlling risk of COVID exposure.  He will go to the store, make it efficient and observe precautions.  Could be more about wife's preference to be more sociable, whereas he is the more introverted by nature.  Reviewed understandings, figured her message to him is really more that she is disappointed, lonely if he doesn't want to keep her company going out.  Advised to clarify with her  how it matters to her and to him, come to constructive compromises what they could do, share away from the house.  Already figures he will prioritize a couple things to do with her, like drive on the [place].  Advised on perception-checking process with her.  Notes concern for [name] that she is at-risk for COVID, maybe taking too many risks with social contact herself.  Scared him one day her going out without letting him know, left her keys and phone, couldn't find her.  Turned out to be at the new neighbors' house, inside, greeting them, not necessarily well distanced for COVID precautions.  Initially irritated conversation, got to constructive agreements about giving notice and taking precautions.  Does notice himself making some "snap judgments" of late and snapping, primarily with [facility].  Realizes it usually ends up being unrealistic expectations of others' behavior.  Says there may be "silly stuff" wife may do which he can fairly attribute to neurological damage from her Guillain-Barre syndrome.  Offered ideas to smooth conflict and practice compassion: keep handy a picture of wife as a child, practice "do-overs" for irritable moments.  Sleep continues well -- 8-9pm bedtime, easily falling asleep, sleeping through to 5-6a, rare and brief MNA about 1a.  Reliable 8-hr sleep.  Continues 1 trazodone.  Occasionally takes a second one in the middle of the night, denies sleepiness, "hangover" effect if he does.  Amber glasses remain helpful for sleep readiness.  Breathing continues more relaxed, anxiety down.  Est 50-60% fluent in breathing technique.  Is using body check recommendation from last time.  Diabetes pretty steady, no complaints.  Continues no identifiable PTSD sxs.  Therapeutic modalities: Cognitive Behavioral Therapy, Assertiveness/Communication and Solution-Oriented/Positive Psychology  Mental Status/Observations:  Appearance:   Not assessed     Behavior:  Appropriate  Motor:  Not  assessed  Speech/Language:   Clear and Coherent  Affect:  Not assessed  Mood:  euthymic  Thought process:  normal  Thought content:    WNL  Sensory/Perceptual disturbances:    WNL  Orientation:  grossly intact  Attention:  Good  Concentration:  Good  Memory:  WNL  Insight:    Good  Judgment:   Good  Impulse Control:  Good   Risk Assessment: Danger to Self: No Self-injurious Behavior: No Danger to Others: No Physical Aggression / Violence: No Duty to Warn: No Access to Firearms a concern: No  Assessment of progress:  progressing well  Diagnosis:   ICD-10-CM   1. Generalized anxiety disorder  F41.1   2. Insomnia, unspecified type  G47.00    well-controlled  3. Recurrent major depressive disorder, in full remission ([place])  F33.42   4. History of posttraumatic stress disorder (PTSD)  Z86.59     Plan:  . Continue with sleep measures . Continue breathing technique for self-soothing, anxiety reduction . Try child picture and/or "do-overs" for snappish moments . Clarifying conversation with wife about wishes, interpretations, and values for going out together  . Follow through volunteering some outings . Other recommendations/advice as noted above . Continue to utilize previously learned skills ad lib . Maintain medication as prescribed and work faithfully with relevant prescriber(s) if any changes are desired or seem indicated . Call the clinic on-call service, present to ER, or call 911 if any life-threatening psychiatric crisis Return in about 1 month (around 02/08/2019) for session(s) already scheduled.  Blanchie Serve, PhD [name], PhD LP Clinical Psychologist, [facility] [address], [place], [place] [address] [phone]

## 2019-02-13 ENCOUNTER — Ambulatory Visit (INDEPENDENT_AMBULATORY_CARE_PROVIDER_SITE_OTHER): Payer: Medicare PPO | Admitting: Psychiatry

## 2019-02-13 ENCOUNTER — Other Ambulatory Visit: Payer: Self-pay

## 2019-02-13 DIAGNOSIS — F411 Generalized anxiety disorder: Secondary | ICD-10-CM

## 2019-02-13 DIAGNOSIS — G4700 Insomnia, unspecified: Secondary | ICD-10-CM | POA: Diagnosis not present

## 2019-02-13 DIAGNOSIS — F3342 Major depressive disorder, recurrent, in full remission: Secondary | ICD-10-CM | POA: Diagnosis not present

## 2019-02-13 DIAGNOSIS — Z8659 Personal history of other mental and behavioral disorders: Secondary | ICD-10-CM

## 2019-02-13 DIAGNOSIS — Z87898 Personal history of other specified conditions: Secondary | ICD-10-CM

## 2019-02-13 DIAGNOSIS — F1011 Alcohol abuse, in remission: Secondary | ICD-10-CM

## 2019-02-13 NOTE — Progress Notes (Signed)
Psychotherapy Progress Note [facility] [name], PhD LP  Patient ID: Grant Medina     MRN: [id number]     Therapy format: Individual psychotherapy Date: 02/13/2019     Start: 1:11p Stop: 1:50p Time Spent: 39 min Location: telehealth   Telehealth visit -- I connected with this patient by an approved telecommunication method (audio only), with his informed consent, and verifying identity and patient privacy.  I was located at my office and patient at his home.  As needed, we discussed the limitations, risks, and security and privacy concerns associated with telehealth service, including the availability and conditions which currently govern in-person appointments and the possibility that 3rd-party payment may not be fully guaranteed and he may be responsible for charges.  After he indicated understanding, we proceeded with the session.  Also discussed treatment planning, as needed, including ongoing verbal agreement with the plan, the opportunity to ask and answer all questions, his demonstrated understanding of instructions, and his readiness to call the office should symptoms worsen or he feels he is in a crisis state and needs more immediate and tangible assistance.  Session narrative (presenting needs, interim history, self-report of stressors and symptoms, applications of prior therapy, status changes, and interventions made in session) Doing good, just COVID driving him nuts about wife's health.  She had cardiac ablation last week, some postop pain.  Family visit to [place], wife bound and determined to see the grandkids, despite news of COVID, and PT to see 80yo mother.  W's grandkids keep needing money.  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  Nervous dog, tends to get under foot, reminds him of himself sometimes.  [name] will have TKR in January.  Has  settled comfortably into the new house by now, just a few little things to take care of.  Diabetes stably treated.  Re. wife, still worries over her residual G-B sxs.  No temper to speak of.  Attributes claming to having settled the house (reduced the incidence of pesky questions where to find things), and keeping a picture of her as a child in his wallet.  Appreciates [name] being sensitive to his mood when recovering from flu shot SE.  No further requests from wife for earlier therapy or anything else.    Still makes use of breathing technique, est. 60% fluent with it.  Still using amber glasses.  Discussed understandings of each other's wishes in the event of tragic developments, seem to be clear about it and able to accept fate if it happens.  Rates himself free right now of depression and relatively low anxiety, focused most on pandemic and safety.  No NMs or flashbacks.  W confirms doing well, getting along well, no present concerns.  Resolved to check in about 4 months, as the seasons, medical issues, social and pandemic issues can all provoke new stress.  Therapeutic modalities: Cognitive Behavioral Therapy and Solution-Oriented/Positive Psychology  Mental Status/Observations:  Appearance:   Not assessed     Behavior:  Appropriate  Motor:  Not assessed  Speech/Language:   clear, minor meandering  Affect:  Not assessed  Mood:  euthymic  Thought process:  normal and minor tangentiality  Thought content:    WNL  Sensory/Perceptual disturbances:    WNL  Orientation:  grossly intact  Attention:  Good  Concentration:  Fair  Memory:  grossly intact  Insight:    Good  Judgment:   Good  Impulse Control:  Good   Risk Assessment: Danger to Self: No Self-injurious Behavior: No Danger to Others: No Physical Aggression / Violence: No Duty to Warn: No Access to Firearms a concern: No  Assessment of progress:  progressing well  Diagnosis:   ICD-10-CM   1. Generalized anxiety disorder   F41.1    stable, residual  2. 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 Recurrent major depressive disorder, in full remission ([place])  F33.42   4. History of posttraumatic stress disorder (PTSD)  Z86.59   5. History of alcohol abuse  F10.11   6. History of chemical exposure  Z87.898     Plan:  . Continue amber glasses and other measures for sleep readiness  . Cont breathing skill for anxiety reduction/control . Cont use of wife's childhood picture for compassion/patience . Other recommendations/advice as noted above . Continue to utilize previously learned skills ad lib . Maintain medication as prescribed and work faithfully with relevant prescriber(s) if any changes are desired or seem indicated . Call the clinic on-call service, present to ER, or call 911 if any life-threatening psychiatric crisis Return in about 4 months (around 06/16/2019) for and needs to RS med check with [place].  Blanchie Serve, PhD [name], PhD LP Clinical Psychologist, [facility] [address], [place], [place] [address] [phone]

## 2019-02-14 ENCOUNTER — Other Ambulatory Visit: Payer: Self-pay | Admitting: Physician Assistant

## 2019-02-14 NOTE — Telephone Encounter (Signed)
Doesn't need currently for next refill

## 2019-03-28 ENCOUNTER — Other Ambulatory Visit: Payer: Self-pay | Admitting: Physician Assistant

## 2019-03-28 NOTE — Telephone Encounter (Signed)
Apt 05/06/2019

## 2019-03-28 NOTE — Telephone Encounter (Signed)
Pt wife [name] called to report Xanax refill due in a week. Please refill @ [facility] on file

## 2019-04-22 ENCOUNTER — Other Ambulatory Visit: Payer: Self-pay | Admitting: Physician Assistant

## 2019-04-24 ENCOUNTER — Other Ambulatory Visit: Payer: Self-pay | Admitting: Physician Assistant

## 2019-05-06 ENCOUNTER — Ambulatory Visit: Payer: Medicare PPO | Admitting: Physician Assistant

## 2019-05-15 ENCOUNTER — Emergency Department (HOSPITAL_COMMUNITY)
Admission: EM | Admit: 2019-05-15 | Discharge: 2019-05-15 | Disposition: A | Discharge status: Home / Self Care | Payer: No Typology Code available for payment source | Attending: Emergency Medicine | Admitting: Emergency Medicine

## 2019-05-15 ENCOUNTER — Other Ambulatory Visit: Payer: Self-pay

## 2019-05-15 ENCOUNTER — Encounter (HOSPITAL_COMMUNITY): Payer: Self-pay | Admitting: Emergency Medicine

## 2019-05-15 DIAGNOSIS — E875 Hyperkalemia: Secondary | ICD-10-CM | POA: Insufficient documentation

## 2019-05-15 DIAGNOSIS — Z794 Long term (current) use of insulin: Secondary | ICD-10-CM | POA: Diagnosis not present

## 2019-05-15 DIAGNOSIS — Z87891 Personal history of nicotine dependence: Secondary | ICD-10-CM | POA: Insufficient documentation

## 2019-05-15 DIAGNOSIS — E109 Type 1 diabetes mellitus without complications: Secondary | ICD-10-CM | POA: Insufficient documentation

## 2019-05-15 DIAGNOSIS — R7989 Other specified abnormal findings of blood chemistry: Secondary | ICD-10-CM | POA: Diagnosis not present

## 2019-05-15 LAB — BASIC METABOLIC PANEL
Anion gap: 10 (ref 5–15)
BUN: 33 mg/dL — ABNORMAL HIGH (ref 8–23)
CO2: 21 mmol/L — ABNORMAL LOW (ref 22–32)
Calcium: 9.5 mg/dL (ref 8.9–10.3)
Chloride: 99 mmol/L (ref 98–111)
Creatinine, Ser: 1.5 mg/dL — ABNORMAL HIGH (ref 0.61–1.24)
GFR calc Af Amer: 55 mL/min — ABNORMAL LOW (ref >60)
GFR calc non Af Amer: 48 mL/min — ABNORMAL LOW (ref >60)
Glucose, Bld: 90 mg/dL (ref 70–99)
Potassium: 5.7 mmol/L — ABNORMAL HIGH (ref 3.5–5.1)
Sodium: 130 mmol/L — ABNORMAL LOW (ref 135–145)

## 2019-05-15 LAB — CBC WITH DIFFERENTIAL/PLATELET
Abs Immature Granulocytes: 0.02 10*3/uL (ref 0.00–0.07)
Basophils Absolute: 0.1 10*3/uL (ref 0.0–0.1)
Basophils Relative: 1 %
Eosinophils Absolute: 0.2 10*3/uL (ref 0.0–0.5)
Eosinophils Relative: 3 %
HCT: 33.1 % — ABNORMAL LOW (ref 39.0–52.0)
Hemoglobin: 10.9 g/dL — ABNORMAL LOW (ref 13.0–17.0)
Immature Granulocytes: 0 %
Lymphocytes Relative: 27 %
Lymphs Abs: 2.1 10*3/uL (ref 0.7–4.0)
MCH: 32.3 pg (ref 26.0–34.0)
MCHC: 32.9 g/dL (ref 30.0–36.0)
MCV: 98.2 fL (ref 80.0–100.0)
Monocytes Absolute: 0.6 10*3/uL (ref 0.1–1.0)
Monocytes Relative: 8 %
Neutro Abs: 4.8 10*3/uL (ref 1.7–7.7)
Neutrophils Relative %: 61 %
Platelets: 286 10*3/uL (ref 150–400)
RBC: 3.37 MIL/uL — ABNORMAL LOW (ref 4.22–5.81)
RDW: 13.7 % (ref 11.5–15.5)
WBC: 7.8 10*3/uL (ref 4.0–10.5)
nRBC: 0 % (ref 0.0–0.2)

## 2019-05-15 NOTE — ED Provider Notes (Signed)
  Face-to-face evaluation   History: He presents for evaluation of hyperkalemia.  He denies recent fever, chills, cough, shortness of breath, nausea, vomiting, diarrhea, food intolerance.  Physical exam: Alert, calm and cooperative.  He is lucid.  Abdomen soft and nontender.  He moves all extremities equally.  Medical screening examination/treatment/procedure(s) were conducted as a shared visit with non-physician practitioner(s) and myself.  I personally evaluated the patient during the encounter    Daleen Bo, MD 05/19/19 [phone]

## 2019-05-15 NOTE — ED Provider Notes (Signed)
[facility] EMERGENCY DEPARTMENT Provider Note   CSN: [id number] Arrival date & time: 05/15/19  1314     History Chief Complaint  Patient presents with  . Abnormal Lab    Grant Medina is a 69 y.o. male.  Grant Medina is a 69 y.o. male with a history of diabetes, depression, skin cancer, mesenteric artery thrombosis, and alcohol abuse, who presents to the ED for evaluation of elevated potassium.  He states that he was seen at the [facility], and received a phone call today that his potassium was elevated at 6.7 and he needed to go to the emergency department for treatment.  Patient states that he is not having any symptoms with this does not feel weak or fatigued, he denies chest pain or palpitations.  He does state that he had eaten 6 bananas prior to potassium being checked, but does have a history of mildly elevated potassium in the past his PCP provided documentation that his kidney function has been creeping up over the last few months.  He denies any nausea or vomiting.  Patient denies any dysuria, dark urine or changes in urination.  Patient is on Metformin and lisinopril, no recent medication changes.  No other aggravating or alleviating factors.        Past Medical History:  Diagnosis Date  . Alcohol abuse, in remission   . Anxiety   . Cancer ([place])    skin  . Depression   . Diabetes mellitus type I ([place])   . Diabetes mellitus without complication ([place])   . 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 PTSD (post-traumatic stress disorder)     Patient Active Problem List   Diagnosis Date Noted  . Type 2 diabetes mellitus ([place]) 01/11/2017    Past Surgical History:  Procedure Laterality Date  . CHOLECYSTECTOMY    . HERNIA REPAIR    . reconstructed bile duct         Family History  Problem Relation Age of Onset  . Dementia Mother   . Anxiety disorder Mother   . Depression Mother   . CAD Mother   . CAD Father   . Dementia Father   . Skin cancer Father   .  Cirrhosis Father   . Anxiety disorder Father   . Alcohol abuse Father   . Cirrhosis Brother   . Hepatitis C Brother   . Depression Brother   . Anxiety disorder Brother   . Kidney Stones Maternal Grandmother   . Alcohol abuse Paternal Grandfather   . Congestive Heart Failure Paternal Grandfather   . CAD Paternal Grandfather     Social History   Tobacco Use  . Smoking status: Former Smoker    Types: E-cigarettes  . Smokeless tobacco: Never Used  . Tobacco comment: quit in 2015  Substance Use Topics  . Alcohol use: No    Comment: quit approx 2011  . Drug use: Never    Home Medications Prior to Admission medications   Medication Sig Start Date End Date Taking? Authorizing Provider  acetaminophen (TYLENOL) 500 MG tablet Take 500 mg by mouth every 4 (four) hours as needed.  01/21/17   [provider]  ALPRAZolam (XANAX) 0.5 MG tablet TAKE 1/2 TO 1 TABLET(0.25 TO 0.5 MG) BY MOUTH TWICE DAILY AS NEEDED FOR ANXIETY 03/31/19   Hurst, Teresa T, PA-C  B Complex-Biotin-FA (B-COMPLEX PO) Take 1 tablet by mouth daily.    [provider]  FLUoxetine (PROZAC) 40 MG capsule TAKE 1  CAPSULE EVERY DAY 04/27/19   Donnal Moat T, PA-C  gabapentin (NEURONTIN) 600 MG tablet Take 600 mg by mouth 3 (three) times daily.    [provider]  HYDROcodone-acetaminophen (NORCO/VICODIN) 5-325 MG tablet Take 1 tablet by mouth every 4 (four) hours as needed. Patient not taking: Reported on 10/31/2018 03/31/18   Fransico Meadow, PA-C  Insulin Degludec (TRESIBA FLEXTOUCH [place]) Inject 16 Units into the skin daily. 16 units daily, every 3rd day 20 units    [provider]  lipase/protease/amylase (CREON) 12000 units CPEP capsule Take 36,000 Units by mouth 3 (three) times daily before meals.    [provider]  lisinopril (PRINIVIL,ZESTRIL) 10 MG tablet Take 10 mg by mouth daily.  04/23/17   [provider]  metFORMIN (GLUCOPHAGE) 1000 MG tablet Take 1,000 mg by mouth 2  (two) times daily with a meal.  04/23/17   [provider]  methocarbamol (ROBAXIN) 500 MG tablet Take 1 tablet (500 mg total) by mouth 2 (two) times daily. Patient not taking: Reported on 10/31/2018 03/31/18   Fransico Meadow, PA-C  mirtazapine (REMERON) 15 MG tablet Take 15 mg by mouth at bedtime.    [provider]  Multiple Vitamin (DAILY VITE PO) Take 1 tablet by mouth daily.    [provider]  Multiple Vitamins-Minerals (CENTRUM ADULTS PO)  01/21/17   [provider]  naltrexone (DEPADE) 50 MG tablet Take 50 mg by mouth daily.    [provider]  ondansetron (ZOFRAN) 4 MG tablet TK 1 T PO TID FOR 10 DAYS 12/09/18   [provider]  pioglitazone (ACTOS) 15 MG tablet TK 1 T PO QD 12/06/18   [provider]  polyethylene glycol powder (GLYCOLAX/MIRALAX) powder  04/03/17   [provider]  rivaroxaban (XARELTO) 20 MG TABS tablet Take by mouth. 10/07/18   [provider]  tamsulosin (FLOMAX) 0.4 MG CAPS capsule  11/19/18   [provider]  traZODone (DESYREL) 50 MG tablet TAKE 1/2 TO 2 TABLETS AT BEDTIME AS NEEDED FOR SLEEP. 04/22/19   Donnal Moat T, PA-C  warfarin (COUMADIN) 10 MG tablet Take 10 mg by mouth one time only at 6 PM. Take 2mg  tablet on all days except for Thursday, Saturday, Sunday take 3mg  tablet. 04/19/17   [provider]  warfarin (COUMADIN) 2 MG tablet  03/27/17   [provider]  warfarin (COUMADIN) 3 MG tablet  02/27/17   [provider]    Allergies    Patient has no known allergies.  Review of Systems   Review of Systems  Constitutional: Negative for chills, fatigue and fever.  HENT: Negative.   Respiratory: Negative for cough and shortness of breath.   Cardiovascular: Negative for chest pain, palpitations and leg swelling.  Gastrointestinal: Negative for abdominal pain, diarrhea and vomiting.  Genitourinary: Negative for dysuria and frequency.    Musculoskeletal: Negative for arthralgias and myalgias.  Skin: Negative for color change and rash.  Neurological: Negative for dizziness, syncope and light-headedness.  All other systems reviewed and are negative.   Physical Exam Updated Vital Signs BP 139/73 (BP Location: Right Arm)   Pulse 87   Temp 97.9 F (36.6 C) (Oral)   Resp 16   Ht 5\' 7"  (1.702 m)   Wt 77.1 kg   SpO2 100%   BMI 26.63 kg/m   Physical Exam Vitals and nursing note reviewed.  Constitutional:      General: He is not in acute distress.  Appearance: Normal appearance. He is well-developed and normal weight. He is not ill-appearing or diaphoretic.     Comments: Well-appearing and in no distress.  HENT:     Head: Normocephalic and atraumatic.  Eyes:     General:        Right eye: No discharge.        Left eye: No discharge.  Cardiovascular:     Rate and Rhythm: Normal rate and regular rhythm.     Heart sounds: Normal heart sounds. No murmur. No friction rub. No gallop.   Pulmonary:     Effort: Pulmonary effort is normal. No respiratory distress.     Breath sounds: Normal breath sounds. No wheezing or rales.     Comments: Respirations equal and unlabored, patient able to speak in full sentences, lungs clear to auscultation bilaterally Abdominal:     General: Bowel sounds are normal. There is no distension.     Palpations: Abdomen is soft. There is no mass.     Tenderness: There is no abdominal tenderness. There is no guarding.     Comments: Abdomen soft, nondistended, nontender to palpation in all quadrants without guarding or peritoneal signs  Musculoskeletal:        General: No deformity.     Cervical back: Neck supple.  Skin:    General: Skin is warm and dry.     Capillary Refill: Capillary refill takes less than 2 seconds.  Neurological:     Mental Status: He is alert.     Coordination: Coordination normal.     Comments: Speech is clear, able to follow commands Moves extremities without  ataxia, coordination intact  Psychiatric:        Mood and Affect: Mood normal.        Behavior: Behavior normal.     ED Results / Procedures / Treatments   Labs (all labs ordered are listed, but only abnormal results are displayed) Labs Reviewed  BASIC METABOLIC PANEL - Abnormal; Notable for the following components:      Result Value   Sodium 130 (*)    Potassium 5.7 (*)    CO2 21 (*)    BUN 33 (*)    Creatinine, Ser 1.50 (*)    GFR calc non Af Amer 48 (*)    GFR calc Af Amer 55 (*)    All other components within normal limits  CBC WITH DIFFERENTIAL/PLATELET - Abnormal; Notable for the following components:   RBC 3.37 (*)    Hemoglobin 10.9 (*)    HCT 33.1 (*)    All other components within normal limits    EKG None  Radiology No results found.  Procedures Procedures (including critical care time)  Medications Ordered in ED Medications - No data to display  ED Course  I have reviewed the triage vital signs and the nursing notes.  Pertinent labs & imaging results that were available during my care of the patient were reviewed by me and considered in my medical decision making (see chart for details).    MDM Rules/Calculators/A&P                      69 year old male sent from [place] after he had blood work drawn which revealed elevated potassium.  Potassium reported at 6.7.  Patient has had more mild elevations of his potassium in the past associated with worsening kidney function.  Patient denies any symptoms associated with this on arrival he has normal vitals, EKG shows no  evidence of arrhythmia or peaked T waves.  Will recheck potassium and basic labs here today.  Labs today show an improved potassium of 5.7 with a creatinine of 1.50 this is comparable to patient's lab work completed on 1/12.  I suspect increase dietary intake of potassium may explain the worsening potassium on his most recent labs.  I do not feel that these electrolyte abnormalities require acute  intervention, discussed with patient and provided information on potassium content of foods and will have him try and reduce his dietary intake, stressed the importance of following up closely with his primary care doctor for recheck of his kidney function and potassium and further evaluation of his CKD.  Did not see any medications on his list that need adjustment at this time.  Patient discussed with Dr. Eulis Foster, who saw patient as well and agrees with plan.  Final Clinical Impression(s) / ED Diagnoses Final diagnoses:  Hyperkalemia  Elevated serum creatinine    Rx / DC Orders ED Discharge Orders    None       Janet Berlin 05/15/19 1622    Daleen Bo, MD 05/19/19 [phone]

## 2019-05-15 NOTE — Discharge Instructions (Addendum)
Use the handout provided to reduce the amount of potassium in your diet.  Try and avoid foods that are high in potassium and eat foods that are moderate potassium in moderation.  You will need to follow-up closely with your primary care doctor in the next few days for recheck of your kidney function and potassium.  Return to the ED for any new or worsening symptoms.

## 2019-05-15 NOTE — ED Triage Notes (Signed)
Patient states he had blood drawn at the [facility] today and was told his potassium was too high.

## 2019-05-29 ENCOUNTER — Telehealth: Payer: Self-pay | Admitting: Physician Assistant

## 2019-05-29 ENCOUNTER — Other Ambulatory Visit: Payer: Self-pay

## 2019-05-29 MED ORDER — ALPRAZOLAM 0.5 MG PO TABS: ORAL_TABLET | ORAL | 1 refills | Status: DC

## 2019-05-29 NOTE — Telephone Encounter (Signed)
Pt requesting a refill on the Xanax. Fill at the [facility] in [place]. Appt sched for 2/8

## 2019-05-29 NOTE — Telephone Encounter (Signed)
Last refill 04/29/2019, has apt 06/02/2019 Pended for Helene Kelp to submit

## 2019-06-02 ENCOUNTER — Encounter: Payer: Self-pay | Admitting: Physician Assistant

## 2019-06-02 ENCOUNTER — Ambulatory Visit (INDEPENDENT_AMBULATORY_CARE_PROVIDER_SITE_OTHER): Payer: Medicare PPO | Admitting: Physician Assistant

## 2019-06-02 DIAGNOSIS — G4700 Insomnia, unspecified: Secondary | ICD-10-CM

## 2019-06-02 DIAGNOSIS — F4310 Post-traumatic stress disorder, unspecified: Secondary | ICD-10-CM | POA: Diagnosis not present

## 2019-06-02 DIAGNOSIS — F411 Generalized anxiety disorder: Secondary | ICD-10-CM | POA: Diagnosis not present

## 2019-06-02 DIAGNOSIS — F3342 Major depressive disorder, recurrent, in full remission: Secondary | ICD-10-CM

## 2019-06-02 NOTE — Progress Notes (Signed)
[facility]  Patient ID: Grant Medina,  MRN: [id number]  PCP: Esperanza Sheets, FNP  Date of Evaluation: 06/02/2019 Time spent:25 minutes  Chief Complaint:  Chief Complaint    Anxiety; Depression     Virtual Visit via Telephone Note  I connected with patient by a video enabled telemedicine application or telephone, with their informed consent, and verified patient privacy and that I am speaking with the correct person using two identifiers.  I am private, in my office and the patient is home.   I discussed the limitations, risks, security and privacy concerns of performing an evaluation and management service by telephone and the availability of in person appointments. I also discussed with the patient that there may be a patient responsible charge related to this service. The patient expressed understanding and agreed to proceed.   I discussed the assessment and treatment plan with the patient. The patient was provided an opportunity to ask questions and all were answered. The patient agreed with the plan and demonstrated an understanding of the instructions.   The patient was advised to call back or seek an in-person evaluation if the symptoms worsen or if the condition fails to improve as anticipated.  I provided 25 minutes of non-face-to-face time during this encounter.  HISTORY/CURRENT STATUS: HPI For 6 month med check. Wife is also on the phone.   Arel states he is doing well considering the stress he is under. His wife has just been diagnosed with breast cancer and has had a mastectomy. She is going to [facility] and may need chemo and radiation soon. "I feel like she will be all right but it is just a matter of getting everything coordinated."  States anxiety is well controlled with the Xanax. He does need it every day or he has this impending sense that something bad is going to happen. He sleeps well using the trazodone.  Patient denies loss of interest in usual  activities and is able to enjoy things.  Denies decreased energy or motivation.  Appetite has not changed.  No extreme sadness, tearfulness, or feelings of hopelessness.  Denies any changes in concentration, making decisions or remembering things.  Denies suicidal or homicidal thoughts.  Patient denies increased energy with decreased need for sleep, no increased talkativeness, no racing thoughts, no impulsivity or risky behaviors, no increased spending, no increased libido, no grandiosity.  Denies muscle or joint pain, stiffness, or dystonia. Denies dizziness, syncope, seizures, numbness, tingling, tremor, tics, unsteady gait, slurred speech, confusion.   Individual Medical History/ Review of Systems: Changes? :No  Had labs recently through the [place] system. States his hemoglobin A1c was 6.8 which has decreased a lot.  Past medications for mental health diagnoses include: Xanax, Prozac, Ambien, Effexor, Wellbutrin  Allergies: Patient has no known allergies.  Current Medications:  Current Outpatient Medications:  .  acetaminophen (TYLENOL) 500 MG tablet, Take 500 mg by mouth every 4 (four) hours as needed. , Disp: , Rfl:  .  ALPRAZolam (XANAX) 0.5 MG tablet, TAKE 1/2 TO 1 TABLET(0.25 TO 0.5 MG) BY MOUTH TWICE DAILY AS NEEDED FOR ANXIETY, Disp: 60 tablet, Rfl: 1 .  B Complex-Biotin-FA (B-COMPLEX PO), Take 1 tablet by mouth daily., Disp: , Rfl:  .  FLUoxetine (PROZAC) 40 MG capsule, TAKE 1 CAPSULE EVERY DAY, Disp: 90 capsule, Rfl: 0 .  gabapentin (NEURONTIN) 600 MG tablet, Take 600 mg by mouth 3 (three) times daily., Disp: , Rfl:  .  Insulin Degludec (TRESIBA FLEXTOUCH [place]), Inject  16 Units into the skin daily. 16 units daily, every 3rd day 20 units, Disp: , Rfl:  .  lipase/protease/amylase (CREON) 12000 units CPEP capsule, Take 36,000 Units by mouth 3 (three) times daily before meals., Disp: , Rfl:  .  lisinopril (PRINIVIL,ZESTRIL) 10 MG tablet, Take 10 mg by mouth daily. , Disp: , Rfl:  .   metFORMIN (GLUCOPHAGE) 1000 MG tablet, Take 1,000 mg by mouth 2 (two) times daily with a meal. , Disp: , Rfl:  .  mirtazapine (REMERON) 15 MG tablet, Take 15 mg by mouth at bedtime., Disp: , Rfl:  .  Multiple Vitamin (DAILY VITE PO), Take 1 tablet by mouth daily., Disp: , Rfl:  .  Multiple Vitamins-Minerals (CENTRUM ADULTS PO), , Disp: , Rfl:  .  naltrexone (DEPADE) 50 MG tablet, Take 50 mg by mouth daily., Disp: , Rfl:  .  pioglitazone (ACTOS) 15 MG tablet, TK 1 T PO QD, Disp: , Rfl:  .  rivaroxaban (XARELTO) 20 MG TABS tablet, Take by mouth., Disp: , Rfl:  .  tamsulosin (FLOMAX) 0.4 MG CAPS capsule, , Disp: , Rfl:  .  traZODone (DESYREL) 50 MG tablet, TAKE 1/2 TO 2 TABLETS AT BEDTIME AS NEEDED FOR SLEEP., Disp: 180 tablet, Rfl: 0 .  HYDROcodone-acetaminophen (NORCO/VICODIN) 5-325 MG tablet, Take 1 tablet by mouth every 4 (four) hours as needed. (Patient not taking: Reported on 10/31/2018), Disp: 10 tablet, Rfl: 0 .  methocarbamol (ROBAXIN) 500 MG tablet, Take 1 tablet (500 mg total) by mouth 2 (two) times daily. (Patient not taking: Reported on 10/31/2018), Disp: 20 tablet, Rfl: 0 .  ondansetron (ZOFRAN) 4 MG tablet, TK 1 T PO TID FOR 10 DAYS, Disp: , Rfl:  .  polyethylene glycol powder (GLYCOLAX/MIRALAX) powder, , Disp: , Rfl:  .  warfarin (COUMADIN) 10 MG tablet, Take 10 mg by mouth one time only at 6 PM. Take 2mg  tablet on all days except for Thursday, Saturday, Sunday take 3mg  tablet., Disp: , Rfl:  .  warfarin (COUMADIN) 2 MG tablet, , Disp: , Rfl:  .  warfarin (COUMADIN) 3 MG tablet, , Disp: , Rfl:  Medication Side Effects: none  Family Medical/ Social History: Changes? Yes wife has been dx w/ breast cancer.  Has had a mastectomy.   MENTAL HEALTH EXAM:  There were no vitals taken for this visit.There is no height or weight on file to calculate BMI.  General Appearance: unable to assess  Eye Contact:  unable to assess  Speech:  Clear and Coherent  Volume:  Normal  Mood:  Euthymic   Affect:  unable to assess  Thought Process:  Goal Directed and Descriptions of Associations: Intact  Orientation:  Full (Time, Place, and Person)  Thought Content: Logical   Suicidal Thoughts:  No  Homicidal Thoughts:  No  Memory:  WNL  Judgement:  Good  Insight:  Good  Psychomotor Activity:  unable to assess  Concentration:  Concentration: Good  Recall:  Good  Fund of Knowledge: Good  Language: Good  Assets:  Desire for Improvement  ADL's:  Intact  Cognition: WNL  Prognosis:  Good    DIAGNOSES:    ICD-10-CM   1. Recurrent major depressive disorder, in full remission ([place])  F33.42   2. Generalized anxiety disorder  F41.1   3. Insomnia, unspecified type  G47.00   4. PTSD (post-traumatic stress disorder)  F43.10     Receiving Psychotherapy: Yes  Dr. Jonni Sanger [place]   RECOMMENDATIONS:  I'm sorry to hear about his  wife's illness.  I spent 25 minutes with him. PDMP was reviewed. Continue Xanax 0.5 mg, 1/2-1 twice daily as needed. Continue Prozac 40 mg daily. Continue gabapentin 600 mg one p.o. 3 times daily. (Uses for neuropathy.) Continue naltrexone 50 mg daily. Continue trazodone 50 mg, 1/2 to 2 tablets nightly as needed sleep. Continue therapy with Dr. [name] Return in 6 months.  Donnal Moat, PA-C

## 2019-07-28 ENCOUNTER — Other Ambulatory Visit: Payer: Self-pay

## 2019-07-28 ENCOUNTER — Telehealth: Payer: Self-pay | Admitting: Physician Assistant

## 2019-07-28 MED ORDER — ALPRAZOLAM 0.5 MG PO TABS: ORAL_TABLET | ORAL | 3 refills | Status: DC

## 2019-07-28 NOTE — Telephone Encounter (Signed)
Last refill 06/26/2019, pended for Grant Medina to submit Patient scheduled back in August 2021

## 2019-07-28 NOTE — Telephone Encounter (Signed)
Pt would like a refill on Alprazolam. Please send to [facility] in [place], [place] on [address]

## 2019-07-31 ENCOUNTER — Other Ambulatory Visit: Payer: Self-pay | Admitting: Physician Assistant

## 2019-12-01 ENCOUNTER — Encounter: Payer: Self-pay | Admitting: Physician Assistant

## 2019-12-01 ENCOUNTER — Other Ambulatory Visit: Payer: Self-pay

## 2019-12-01 ENCOUNTER — Ambulatory Visit (INDEPENDENT_AMBULATORY_CARE_PROVIDER_SITE_OTHER): Payer: Medicare PPO | Admitting: Physician Assistant

## 2019-12-01 DIAGNOSIS — F1011 Alcohol abuse, in remission: Secondary | ICD-10-CM

## 2019-12-01 DIAGNOSIS — F411 Generalized anxiety disorder: Secondary | ICD-10-CM

## 2019-12-01 DIAGNOSIS — F4310 Post-traumatic stress disorder, unspecified: Secondary | ICD-10-CM

## 2019-12-01 DIAGNOSIS — F3342 Major depressive disorder, recurrent, in full remission: Secondary | ICD-10-CM

## 2019-12-01 DIAGNOSIS — G4700 Insomnia, unspecified: Secondary | ICD-10-CM | POA: Diagnosis not present

## 2019-12-01 MED ORDER — ALPRAZOLAM 0.5 MG PO TABS: ORAL_TABLET | ORAL | 5 refills | Status: DC

## 2019-12-01 NOTE — Progress Notes (Signed)
[facility]  Patient ID: Grant Medina,  MRN: [id number]  PCP: Esperanza Sheets, FNP  Date of Evaluation: 12/01/2019 Time spent:20 minutes  Chief Complaint:  Chief Complaint    Anxiety; Depression; Insomnia      HISTORY/CURRENT STATUS: HPI For 6 month med check.  Wife, Curt Bears, is with him.  Doing well 'considering everything that's going on.'  Wife was dx w/ breast cancer last year, had XRT and chemo, then left mastectomy.  Is cancer free now.  He's dealing with cataracts and will have surgery later on this month. And is getting hearing aids also.  Able to enjoy things, energy and motivation are fine, isolating but only b/c of COVID, no SI/HI.  States anxiety is well controlled with the Xanax. He does need it every day or he has this impending sense that something bad is going to happen. He sleeps well using the trazodone. He states the [place] has been giving him Mirtazepine for sleep too. Taking both now.  Doesn't think the Mirtazepine has done anything.  Patient denies increased energy with decreased need for sleep, no increased talkativeness, no racing thoughts, no impulsivity or risky behaviors, no increased spending, no increased libido, no grandiosity.  Denies muscle or joint pain, stiffness, or dystonia. Denies dizziness, syncope, seizures, numbness, tingling, tremor, tics, unsteady gait, slurred speech, confusion.   Individual Medical History/ Review of Systems: Changes? :No    Past medications for mental health diagnoses include: Xanax, Prozac, Ambien, Effexor, Wellbutrin  Allergies: Patient has no known allergies.  Current Medications:  Current Outpatient Medications:  .  acetaminophen (TYLENOL) 500 MG tablet, Take 500 mg by mouth every 4 (four) hours as needed. , Disp: , Rfl:  .  ALPRAZolam (XANAX) 0.5 MG tablet, TAKE 1/2 TO 1 TABLET(0.25 TO 0.5 MG) BY MOUTH TWICE DAILY AS NEEDED FOR ANXIETY, Disp: 60 tablet, Rfl: 5 .  B Complex-Biotin-FA (B-COMPLEX  PO), Take 1 tablet by mouth daily., Disp: , Rfl:  .  FLUoxetine (PROZAC) 40 MG capsule, TAKE 1 CAPSULE EVERY DAY, Disp: 90 capsule, Rfl: 1 .  gabapentin (NEURONTIN) 600 MG tablet, Take 600 mg by mouth 3 (three) times daily., Disp: , Rfl:  .  Insulin Degludec (TRESIBA FLEXTOUCH [place]), Inject 16 Units into the skin daily. 16 units daily, every 3rd day 20 units, Disp: , Rfl:  .  lipase/protease/amylase (CREON) 12000 units CPEP capsule, Take 36,000 Units by mouth 3 (three) times daily before meals., Disp: , Rfl:  .  metFORMIN (GLUCOPHAGE) 1000 MG tablet, Take 1,000 mg by mouth 2 (two) times daily with a meal. , Disp: , Rfl:  .  Multiple Vitamin (DAILY VITE PO), Take 1 tablet by mouth daily., Disp: , Rfl:  .  Multiple Vitamins-Minerals (CENTRUM ADULTS PO), , Disp: , Rfl:  .  ondansetron (ZOFRAN) 4 MG tablet, TK 1 T PO TID FOR 10 DAYS, Disp: , Rfl:  .  pioglitazone (ACTOS) 15 MG tablet, TK 1 T PO QD, Disp: , Rfl:  .  polyethylene glycol powder (GLYCOLAX/MIRALAX) powder, , Disp: , Rfl:  .  rivaroxaban (XARELTO) 20 MG TABS tablet, Take by mouth., Disp: , Rfl:  .  tamsulosin (FLOMAX) 0.4 MG CAPS capsule, , Disp: , Rfl:  .  traZODone (DESYREL) 50 MG tablet, TAKE 1/2 TO 2 TABLETS AT BEDTIME AS NEEDED FOR SLEEP., Disp: 180 tablet, Rfl: 1 .  HYDROcodone-acetaminophen (NORCO/VICODIN) 5-325 MG tablet, Take 1 tablet by mouth every 4 (four) hours as needed. (Patient not taking: Reported on 10/31/2018),  Disp: 10 tablet, Rfl: 0 .  lisinopril (PRINIVIL,ZESTRIL) 10 MG tablet, Take 10 mg by mouth daily.  (Patient not taking: Reported on 12/01/2019), Disp: , Rfl:  .  methocarbamol (ROBAXIN) 500 MG tablet, Take 1 tablet (500 mg total) by mouth 2 (two) times daily. (Patient not taking: Reported on 10/31/2018), Disp: 20 tablet, Rfl: 0 .  naltrexone (DEPADE) 50 MG tablet, Take 50 mg by mouth daily. (Patient not taking: Reported on 12/01/2019), Disp: , Rfl:  .  warfarin (COUMADIN) 10 MG tablet, Take 10 mg by mouth one time only at 6  PM. Take 2mg  tablet on all days except for Thursday, Saturday, Sunday take 3mg  tablet. (Patient not taking: Reported on 12/01/2019), Disp: , Rfl:  .  warfarin (COUMADIN) 2 MG tablet, , Disp: , Rfl:  .  warfarin (COUMADIN) 3 MG tablet, , Disp: , Rfl:  Medication Side Effects: none  Family Medical/ Social History: Changes? See HPI   MENTAL HEALTH EXAM:  There were no vitals taken for this visit.There is no height or weight on file to calculate BMI.  General Appearance: Casual, Neat and Well Groomed  Eye Contact:  Good  Speech:  Clear and Coherent and Normal Rate  Volume:  Normal  Mood:  Euthymic  Affect:  Appropriate  Thought Process:  Goal Directed and Descriptions of Associations: Intact  Orientation:  Full (Time, Place, and Person)  Thought Content: Logical   Suicidal Thoughts:  No  Homicidal Thoughts:  No  Memory:  WNL  Judgement:  Good  Insight:  Good  Psychomotor Activity:  Normal  Concentration:  Concentration: Good  Recall:  Good  Fund of Knowledge: Good  Language: Good  Assets:  Desire for Improvement  ADL's:  Intact  Cognition: WNL  Prognosis:  Good    DIAGNOSES:    ICD-10-CM   1. Recurrent major depressive disorder, in full remission ([place])  F33.42   2. Generalized anxiety disorder  F41.1   3. Insomnia, unspecified type  G47.00   4. PTSD (post-traumatic stress disorder)  F43.10   5. History of alcohol abuse  F10.11     Receiving Psychotherapy: No    RECOMMENDATIONS:  PDMP was reviewed. I provided 20 minutes of face-to-face time during this encounter. Discontinue mirtazapine. Continue Xanax 0.5 mg, 1/2-1 twice daily as needed. Continue Prozac 40 mg daily. Continue gabapentin 600 mg one p.o. 3 times daily. (Uses for neuropathy.) Discontinue naltrexone, states he is no longer taking it.  I am not sure if it was discontinued at the [place] or what. Continue trazodone 50 mg, 1/2 to 2 tablets nightly as needed sleep. Return in 6 months.  Donnal Moat, PA-C

## 2020-01-14 ENCOUNTER — Other Ambulatory Visit: Payer: Self-pay | Admitting: Physician Assistant

## 2020-01-15 NOTE — Telephone Encounter (Signed)
review 

## 2020-03-01 ENCOUNTER — Other Ambulatory Visit: Payer: Self-pay

## 2020-03-01 ENCOUNTER — Ambulatory Visit (INDEPENDENT_AMBULATORY_CARE_PROVIDER_SITE_OTHER): Payer: Medicare PPO | Admitting: Psychiatry

## 2020-03-01 DIAGNOSIS — F1011 Alcohol abuse, in remission: Secondary | ICD-10-CM

## 2020-03-01 DIAGNOSIS — Z87898 Personal history of other specified conditions: Secondary | ICD-10-CM | POA: Diagnosis not present

## 2020-03-01 DIAGNOSIS — F3341 Major depressive disorder, recurrent, in partial remission: Secondary | ICD-10-CM | POA: Diagnosis not present

## 2020-03-01 DIAGNOSIS — Z8659 Personal history of other mental and behavioral disorders: Secondary | ICD-10-CM | POA: Diagnosis not present

## 2020-03-01 DIAGNOSIS — G8929 Other chronic pain: Secondary | ICD-10-CM

## 2020-03-01 DIAGNOSIS — F411 Generalized anxiety disorder: Secondary | ICD-10-CM | POA: Diagnosis not present
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
## 2020-04-05 ENCOUNTER — Other Ambulatory Visit: Payer: Self-pay

## 2020-04-05 ENCOUNTER — Ambulatory Visit (INDEPENDENT_AMBULATORY_CARE_PROVIDER_SITE_OTHER): Payer: Medicare PPO | Admitting: Psychiatry

## 2020-04-05 DIAGNOSIS — F411 Generalized anxiety disorder: Secondary | ICD-10-CM | POA: Diagnosis not present

## 2020-04-05 DIAGNOSIS — E1140 Type 2 diabetes mellitus with diabetic neuropathy, unspecified: Secondary | ICD-10-CM

## 2020-04-05 DIAGNOSIS — Z794 Long term (current) use of insulin: Secondary | ICD-10-CM

## 2020-04-05 DIAGNOSIS — Z8659 Personal history of other mental and behavioral disorders: Secondary | ICD-10-CM

## 2020-04-05 DIAGNOSIS — F3341 Major depressive disorder, recurrent, in partial remission: Secondary | ICD-10-CM

## 2020-04-05 DIAGNOSIS — F1011 Alcohol abuse, in remission: Secondary | ICD-10-CM

## 2020-04-05 DIAGNOSIS — Z87898 Personal history of other specified conditions: Secondary | ICD-10-CM

## 2020-04-05 DIAGNOSIS — G8929 Other chronic pain: Secondary | ICD-10-CM
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
## 2020-04-06 ENCOUNTER — Other Ambulatory Visit: Payer: Self-pay | Admitting: Physician Assistant

## 2020-04-06 ENCOUNTER — Telehealth: Payer: Self-pay | Admitting: Physician Assistant

## 2020-04-06 MED ORDER — FLUOXETINE HCL 20 MG PO TABS: 20.0000 mg | ORAL_TABLET | Freq: Every day | ORAL | 1 refills | Status: DC

## 2020-04-06 NOTE — Telephone Encounter (Signed)
Spoke w/ pt. He's feeling more sad, has decreased energy and motivation.  He does not really want to do anything, and he is more grouchy.  Recommend increasing Prozac up to a total of 60 mg.  He has 40 mg so I will send in a 30-day supply to the [facility].  His wife will call in about 3 weeks if he is tolerating this well she will let me know and I will send in a 90-day supply to [facility].

## 2020-04-06 NOTE — Telephone Encounter (Signed)
Patient's wife [name] called, wife and Spike has side effects including short temper, outbursts and thinks his Fluvoxamine might need to be increased. [place] at [phone].Next visit is 06/01/20.

## 2020-05-14 ENCOUNTER — Telehealth: Payer: Self-pay | Admitting: Physician Assistant

## 2020-05-14 NOTE — Telephone Encounter (Signed)
Pt requesting refills for Prozac 40 mg  & 20 mg to [facility] mail order for 90 day. Pt will use [employer] for all 90 day and new pharmacy for all  30 day to [facility] [address]  PHONE: [phone] FAX: [phone]

## 2020-05-16 ENCOUNTER — Other Ambulatory Visit: Payer: Self-pay | Admitting: Physician Assistant

## 2020-05-16 MED ORDER — FLUOXETINE HCL 40 MG PO CAPS: 40.0000 mg | ORAL_CAPSULE | Freq: Every day | ORAL | 3 refills | Status: DC

## 2020-05-16 MED ORDER — FLUOXETINE HCL 20 MG PO TABS: 20.0000 mg | ORAL_TABLET | Freq: Every day | ORAL | 3 refills | Status: DC

## 2020-05-16 NOTE — Telephone Encounter (Signed)
Rxs were sent to [facility]

## 2020-05-24 ENCOUNTER — Telehealth: Payer: Self-pay | Admitting: Physician Assistant

## 2020-05-24 ENCOUNTER — Other Ambulatory Visit: Payer: Self-pay | Admitting: Physician Assistant

## 2020-05-24 MED ORDER — FLUOXETINE HCL 20 MG PO CAPS: 20.0000 mg | ORAL_CAPSULE | Freq: Every day | ORAL | 1 refills | Status: DC

## 2020-05-24 NOTE — Telephone Encounter (Signed)
Pt  Called and said that the fluoxetine 20 mg needs to be capsules not tablets. Insurance won't cover. Please cancel and resend to [facility]

## 2020-05-24 NOTE — Telephone Encounter (Signed)
Prescription for Prozac 20 mg capsules was sent

## 2020-05-31 ENCOUNTER — Ambulatory Visit: Payer: Medicare PPO | Admitting: Physician Assistant

## 2020-06-01 ENCOUNTER — Encounter: Payer: Self-pay | Admitting: Physician Assistant

## 2020-06-01 ENCOUNTER — Other Ambulatory Visit: Payer: Self-pay

## 2020-06-01 ENCOUNTER — Ambulatory Visit (INDEPENDENT_AMBULATORY_CARE_PROVIDER_SITE_OTHER): Payer: Medicare PPO | Admitting: Physician Assistant

## 2020-06-01 DIAGNOSIS — F411 Generalized anxiety disorder: Secondary | ICD-10-CM

## 2020-06-01 DIAGNOSIS — F3341 Major depressive disorder, recurrent, in partial remission: Secondary | ICD-10-CM | POA: Diagnosis not present

## 2020-06-01 DIAGNOSIS — G4700 Insomnia, unspecified: Secondary | ICD-10-CM

## 2020-06-01 MED ORDER — ALPRAZOLAM 0.5 MG PO TABS: ORAL_TABLET | ORAL | 5 refills | Status: DC

## 2020-06-01 MED ORDER — TRAZODONE HCL 50 MG PO TABS: ORAL_TABLET | ORAL | 3 refills | Status: DC

## 2020-06-01 NOTE — Progress Notes (Signed)
[facility]  Patient ID: Grant Medina,  MRN: [id number]  PCP: Esperanza Sheets, FNP  Date of Evaluation: 06/01/2020 Time spent:20 minutes  Chief Complaint:  Chief Complaint    Anxiety; Depression; Insomnia      HISTORY/CURRENT STATUS: HPI for f/u med check. Wife, [name] with him.   LOV was 6 mo ago. About 6 weeks ago, he reported more sadness. Prozac was increased and he's doing much better. Able to enjoy things, energy and motivation are good, has slowed down a lot with age, but it's not really hindering him.  He is not isolating.  Not crying easily.  No suicidal or homicidal thoughts.  Patient denies increased energy with decreased need for sleep, no increased talkativeness, no racing thoughts, no impulsivity or risky behaviors, no increased spending, no increased libido, no grandiosity, no increased irritability or anger, and no hallucinations.  He is sleeping well with the trazodone.  He needs it every night.  Feels rested when he gets up.  Still has anxiety, mostly generalized, no panic attacks.  If there is a trigger he may get more anxious than usual but the Xanax continues to be helpful.  His wife states he seems to be forgetting things more.  His dad had Alzheimer's and she is really worried about him.  It is just that he is having a hard time remembering things or if there are directions that have more than 4 steps, he is not able to do them.  He does not feel like it is a huge problem and is just a part of getting older.  Denies dizziness, syncope, seizures, numbness, tingling, tremor, tics, unsteady gait, slurred speech, confusion. Denies muscle or joint pain, stiffness, or dystonia.  Individual Medical History/ Review of Systems: Changes? :No    Past medications for mental health diagnoses include: Xanax, Prozac, Ambien, Effexor, Wellbutrin  Allergies: Patient has no known allergies.  Current Medications:  Current Outpatient Medications:  .   acetaminophen (TYLENOL) 500 MG tablet, Take 500 mg by mouth every 4 (four) hours as needed. , Disp: , Rfl:  .  B Complex-Biotin-FA (B-COMPLEX PO), Take 1 tablet by mouth daily., Disp: , Rfl:  .  FLUoxetine (PROZAC) 20 MG capsule, Take 1 capsule (20 mg total) by mouth daily. Take with 40 mg=60 mg, Disp: 90 capsule, Rfl: 1 .  FLUoxetine (PROZAC) 40 MG capsule, Take 1 capsule (40 mg total) by mouth daily. With 20 mg=60 mg, Disp: 90 capsule, Rfl: 3 .  gabapentin (NEURONTIN) 600 MG tablet, Take 600 mg by mouth 3 (three) times daily., Disp: , Rfl:  .  Insulin Degludec (TRESIBA FLEXTOUCH [place]), Inject 16 Units into the skin daily. 16 units daily, every 3rd day 20 units, Disp: , Rfl:  .  lipase/protease/amylase (CREON) 12000 units CPEP capsule, Take 36,000 Units by mouth 3 (three) times daily before meals., Disp: , Rfl:  .  metFORMIN (GLUCOPHAGE) 1000 MG tablet, Take 1,000 mg by mouth 2 (two) times daily with a meal. , Disp: , Rfl:  .  Multiple Vitamin (DAILY VITE PO), Take 1 tablet by mouth daily., Disp: , Rfl:  .  Multiple Vitamins-Minerals (CENTRUM ADULTS PO), , Disp: , Rfl:  .  ondansetron (ZOFRAN) 4 MG tablet, TK 1 T PO TID FOR 10 DAYS, Disp: , Rfl:  .  pioglitazone (ACTOS) 15 MG tablet, TK 1 T PO QD, Disp: , Rfl:  .  polyethylene glycol powder (GLYCOLAX/MIRALAX) powder, , Disp: , Rfl:  .  rivaroxaban (XARELTO) 20  MG TABS tablet, Take by mouth., Disp: , Rfl:  .  tamsulosin (FLOMAX) 0.4 MG CAPS capsule, , Disp: , Rfl:  .  Turmeric (QC TUMERIC COMPLEX PO), Take by mouth., Disp: , Rfl:  .  ALPRAZolam (XANAX) 0.5 MG tablet, TAKE 1/2 TO 1 TABLET(0.25 TO 0.5 MG) BY MOUTH TWICE DAILY AS NEEDED FOR ANXIETY, Disp: 60 tablet, Rfl: 5 .  HYDROcodone-acetaminophen (NORCO/VICODIN) 5-325 MG tablet, Take 1 tablet by mouth every 4 (four) hours as needed. (Patient not taking: No sig reported), Disp: 10 tablet, Rfl: 0 .  lisinopril (PRINIVIL,ZESTRIL) 10 MG tablet, Take 10 mg by mouth daily.  (Patient not taking: No sig  reported), Disp: , Rfl:  .  methocarbamol (ROBAXIN) 500 MG tablet, Take 1 tablet (500 mg total) by mouth 2 (two) times daily. (Patient not taking: No sig reported), Disp: 20 tablet, Rfl: 0 .  naltrexone (DEPADE) 50 MG tablet, Take 50 mg by mouth daily. (Patient not taking: No sig reported), Disp: , Rfl:  .  traZODone (DESYREL) 50 MG tablet, TAKE 1/2 TO 2 TABLETS AT BEDTIME AS NEEDED FOR SLEEP., Disp: 180 tablet, Rfl: 3 .  warfarin (COUMADIN) 10 MG tablet, Take 10 mg by mouth one time only at 6 PM. Take 2mg  tablet on all days except for Thursday, Saturday, Sunday take 3mg  tablet. (Patient not taking: No sig reported), Disp: , Rfl:  .  warfarin (COUMADIN) 2 MG tablet, , Disp: , Rfl:  .  warfarin (COUMADIN) 3 MG tablet, , Disp: , Rfl:  Medication Side Effects: none  Family Medical/ Social History: Changes? Yes Had to put their 34 yo dog down last month.   MENTAL HEALTH EXAM:  There were no vitals taken for this visit.There is no height or weight on file to calculate BMI.  General Appearance: Casual, Neat and Well Groomed  Eye Contact:  Good  Speech:  Clear and Coherent and Normal Rate  Volume:  Normal  Mood:  Euthymic  Affect:  Appropriate  Thought Process:  Goal Directed and Descriptions of Associations: Intact  Orientation:  Full (Time, Place, and Person)  Thought Content: Logical   Suicidal Thoughts:  No  Homicidal Thoughts:  No  Memory:  WNL  Judgement:  Good  Insight:  Good  Psychomotor Activity:  Normal  Concentration:  Concentration: Good  Recall:  Good  Fund of Knowledge: Good  Language: Good  Assets:  Desire for Improvement  ADL's:  Intact  Cognition: WNL  Prognosis:  Good    DIAGNOSES:    ICD-10-CM   1. Recurrent major depressive disorder, in partial remission ([place])  F33.41   2. Generalized anxiety disorder  F41.1   3. Insomnia, unspecified type  G47.00     Receiving Psychotherapy: No    RECOMMENDATIONS:  PDMP was reviewed. I provided 20 minutes of  face-to-face time during this encounter, including reviewing previous phone notes and progress notes.  I am glad to see him doing better since we increased the Prozac a month or so ago.  There is no need to make changes now. As far as the forgetfulness that his wife reports, at this point I do not think it is worrisome.  He scored well on the animal naming test, 18.  If the forgetfulness worsens then he should see neurologist. Continue Xanax 0.5 mg, 1/2-1 p.o. twice daily as needed. Continue Prozac 60 mg, p.o. daily. Continue gabapentin 600 mg, 1 p.o. 3 times daily. Continue trazodone 50 mg, 1/2-2 p.o. nightly as needed sleep. Continue counseling  with Dr. [name]. Return in 6 months.     Donnal Moat, PA-C

## 2020-06-10 ENCOUNTER — Other Ambulatory Visit: Payer: Self-pay | Admitting: Physician Assistant

## 2020-07-05 ENCOUNTER — Ambulatory Visit (INDEPENDENT_AMBULATORY_CARE_PROVIDER_SITE_OTHER): Payer: Medicare PPO | Admitting: Psychiatry

## 2020-07-05 ENCOUNTER — Other Ambulatory Visit: Payer: Self-pay

## 2020-07-05 DIAGNOSIS — Z87898 Personal history of other specified conditions: Secondary | ICD-10-CM

## 2020-07-05 DIAGNOSIS — F3341 Major depressive disorder, recurrent, in partial remission: Secondary | ICD-10-CM | POA: Diagnosis not present

## 2020-07-05 DIAGNOSIS — F4322 Adjustment disorder with anxiety: Secondary | ICD-10-CM

## 2020-07-05 DIAGNOSIS — F411 Generalized anxiety disorder: Secondary | ICD-10-CM

## 2020-07-05 DIAGNOSIS — Z8659 Personal history of other mental and behavioral disorders: Secondary | ICD-10-CM

## 2020-07-05 NOTE — Progress Notes (Signed)
Psychotherapy Progress Note [facility] Grant Moore, PhD LP  Patient ID: Grant Medina     MRN: [id number] Therapy format: Family therapy w/ patient -- accompanied by Grant Medina Date: 07/05/2020      Start: 11:08a     Stop: 11:58a     Time Spent: 50 min Location: In-person   Session narrative (presenting needs, interim history, self-report of stressors and symptoms, applications of prior therapy, status changes, and interventions made in session) Roommate Grant Medina (older man) is dying, with neck cancer, the culmination of years of self-neglect, drug abuse, mistrust of doctors, and chronically complaining, even though he has health care available.  Very negative attitude toward authority, law enforcement, and seems to be increasingly putting them in position to have to wonder how to respond to things, e.g., a gun he supposedly has hidden in the woods, his wishes to go on and die and not to contact estranged family members.  Advised to come up with their list of "What do you want [place] to do if ___?" questions, including things like Grant Medina choking and whether to call 911, and how to handle his affairs if he dies at their house.  Might obtain a MOST form from [place] and go through specifics of living-will choices with him.  Have seen him refuse a lot but also need to go to the doctor urgently for pain.  Probably Hospice referral.  Grant Medina worries that he may seek retribution if they don't call 911 and he wants to charge them with neglect or depraved indifference, but made clear that Grant Medina is not et up to do that -- he already sings their praises for treating him better than anyone else has, and no police force or court in the land will busy themselves trying to prosecute on speculation they prevented emergency service to a career drug addict with no family ties and a bad attitude.  Generally, Grant Medina's mood has been OK.  Grant Medina worries he's get depressed thinking about Grant Medina and his situation.   She, for her part, is cancer-free at this point.  Other stresses with inflation fears and costs.  Grant Medina says he just keeps going, continuing to manage normally, the two of them love each other well no conflict, and he really doesn't despair about Grant Medina even in the same breath he says he doesn't know what he's going to do about him.  Reiterated to come up with the what-if list, ask the questions, get Grant Medina's wishes.  If needed, get signed (preferably witnessed) affidavit   Grant Medina suggests going 6 months to f/u but persuaded he is probably going to have other questions or needs with the [place] situation, better to set up within a month so as to have available.  Hospice will be able to answer a number of questions once engaged.  Also wants telehealth if able, to save gas.  Informed about PHE conditions and ground rules for telehealth.  Therapeutic modalities: Cognitive Behavioral Therapy, Solution-Oriented/Positive Psychology and Psycho-education/Bibliotherapy  Mental Status/Observations:  Appearance:   Casual     Behavior:  Appropriate  Motor:  Normal  Speech/Language:   Clear and Coherent  Affect:  Appropriate  Mood:  dysthymic, responsive  Thought process:  mild flight  Thought content:    WNL and various worries  Sensory/Perceptual disturbances:    WNL  Orientation:  grossly intact  Attention:  Good    Concentration:  Fair  Memory:  grossly intact  Insight:    Good  Judgment:  Fair  Impulse Control:  Good   Risk Assessment: Danger to Self: No Self-injurious Behavior: No Danger to Others: No Physical Aggression / Violence: No Duty to Warn: No Access to Firearms a concern: No  Assessment of progress:  stabilized  Diagnosis:   ICD-10-CM   1. Recurrent major depressive disorder, in partial remission (Grant Medina)  F33.41   2. Adjustment disorder with anxious mood  F43.22   3. Generalized anxiety disorder  F41.1   4. History of posttraumatic stress disorder (PTSD)  Z86.59   5. History of chemical  exposure  Z87.898    Plan:  . Come up with list of situations they might face with Grant Medina's health, safety, and final wishes -- ask Grant Medina to specify how he wants them handled . Endorse the absolute right to make the call if Grant Medina is not in a position to make his own -- no such thing as the wrong choice made in care/concern for an irrational, terminal friend . Check on Hospice services, put on notice of impending need . Check with insurance about telehealth coverage as we near potential expiration of the PHE . Other recommendations/advice as may be noted above . Continue to utilize previously learned skills ad lib . Maintain medication as prescribed and work faithfully with relevant prescriber(s) if any changes are desired or seem indicated . Call the clinic on-call service, present to ER, or call 911 if any life-threatening psychiatric crisis Return in about 1 month (around 08/05/2020) for may need video. . Already scheduled visit in this office 12/08/2020.  Blanchie Serve, PhD Grant Moore, PhD LP Clinical Psychologist, [facility] [address] [place], [place] [address] [phone]

## 2020-08-09 ENCOUNTER — Ambulatory Visit: Payer: Medicare PPO | Admitting: Psychiatry

## 2020-09-03 ENCOUNTER — Ambulatory Visit: Payer: Medicare PPO | Admitting: Psychiatry

## 2020-09-22 ENCOUNTER — Ambulatory Visit: Payer: Medicare PPO | Admitting: Psychiatry

## 2020-10-13 ENCOUNTER — Ambulatory Visit (INDEPENDENT_AMBULATORY_CARE_PROVIDER_SITE_OTHER): Payer: Medicare PPO | Admitting: Psychiatry

## 2020-10-13 ENCOUNTER — Other Ambulatory Visit: Payer: Self-pay

## 2020-10-13 DIAGNOSIS — Z8659 Personal history of other mental and behavioral disorders: Secondary | ICD-10-CM | POA: Diagnosis not present

## 2020-10-13 DIAGNOSIS — F411 Generalized anxiety disorder: Secondary | ICD-10-CM | POA: Diagnosis not present

## 2020-10-13 DIAGNOSIS — Z794 Long term (current) use of insulin: Secondary | ICD-10-CM

## 2020-10-13 DIAGNOSIS — Z87898 Personal history of other specified conditions: Secondary | ICD-10-CM | POA: Diagnosis not present

## 2020-10-13 DIAGNOSIS — F3341 Major depressive disorder, recurrent, in partial remission: Secondary | ICD-10-CM

## 2020-10-13 DIAGNOSIS — F1011 Alcohol abuse, in remission: Secondary | ICD-10-CM

## 2020-10-13 DIAGNOSIS — E1140 Type 2 diabetes mellitus with diabetic neuropathy, unspecified: Secondary | ICD-10-CM

## 2020-10-13 DIAGNOSIS — G8929 Other chronic pain: Secondary | ICD-10-CM
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
## 2020-10-18 ENCOUNTER — Other Ambulatory Visit: Payer: Self-pay | Admitting: Physician Assistant

## 2020-11-29 ENCOUNTER — Other Ambulatory Visit: Payer: Self-pay | Admitting: Physician Assistant

## 2020-11-29 NOTE — Telephone Encounter (Signed)
Due for refill. Upcoming apt this month

## 2020-12-08 ENCOUNTER — Encounter: Payer: Self-pay | Admitting: Physician Assistant

## 2020-12-08 ENCOUNTER — Ambulatory Visit: Payer: Medicare PPO | Admitting: Physician Assistant

## 2020-12-08 ENCOUNTER — Other Ambulatory Visit: Payer: Self-pay

## 2020-12-08 DIAGNOSIS — F411 Generalized anxiety disorder: Secondary | ICD-10-CM | POA: Diagnosis not present

## 2020-12-08 DIAGNOSIS — F4310 Post-traumatic stress disorder, unspecified: Secondary | ICD-10-CM | POA: Diagnosis not present

## 2020-12-08 DIAGNOSIS — F330 Major depressive disorder, recurrent, mild: Secondary | ICD-10-CM | POA: Diagnosis not present

## 2020-12-08 DIAGNOSIS — G4700 Insomnia, unspecified: Secondary | ICD-10-CM | POA: Diagnosis not present

## 2020-12-08 MED ORDER — FLUOXETINE HCL 40 MG PO CAPS: 80.0000 mg | ORAL_CAPSULE | Freq: Every day | ORAL | 3 refills | Status: DC

## 2020-12-08 MED ORDER — TRAZODONE HCL 100 MG PO TABS: 100.0000 mg | ORAL_TABLET | Freq: Every evening | ORAL | 1 refills | Status: DC | PRN

## 2020-12-08 NOTE — Progress Notes (Signed)
[facility]  Patient ID: Grant Medina,  MRN: [id number]  PCP: Esperanza Sheets, FNP  Date of Evaluation: 12/08/2020 Time spent:30 minutes  Chief Complaint:  Chief Complaint   Depression; Anxiety; Follow-up      HISTORY/CURRENT STATUS: HPI for f/u med check. Wife with him.  At least once a month, sometimes twice, has depression wonders what he's doing here, but denies suicidal thoughts.  Those feelings do not last very long, usually not even a day, but they are not to be bothersome.  He is able to enjoy things when he gets the chance.  Energy and motivation are low.  He does not isolate.  Appetite is normal and weight is stable. No SI/HI.  Patient denies increased energy with decreased need for sleep, no increased talkativeness, no racing thoughts, no impulsivity or risky behaviors, no increased spending, no increased libido, no grandiosity, no increased irritability or anger, and no hallucinations.  He does continue to have anxiety.  If he does not take the Xanax he gets really panicky.  Sleeps well with the trazodone.  Denies dizziness, syncope, seizures, numbness, tingling, tremor, tics, unsteady gait, slurred speech, confusion. Denies muscle or joint pain, stiffness, or dystonia.  Individual Medical History/ Review of Systems: Changes? :No    Past medications for mental health diagnoses include: Xanax, Prozac, Ambien, Effexor, Wellbutrin  Allergies: Patient has no known allergies.  Current Medications:  Current Outpatient Medications:    acetaminophen (TYLENOL) 500 MG tablet, Take 500 mg by mouth every 4 (four) hours as needed. , Disp: , Rfl:    ALPRAZolam (XANAX) 0.5 MG tablet, TAKE 1/2 TO 1 TABLET BY MOUTH TWICE DAILY AS NEEDED FOR ANXIETY, Disp: 60 tablet, Rfl: 5   B Complex-Biotin-FA (B-COMPLEX PO), Take 1 tablet by mouth daily., Disp: , Rfl:    gabapentin (NEURONTIN) 600 MG tablet, Take 600 mg by mouth 3 (three) times daily., Disp: , Rfl:    Insulin  Degludec (TRESIBA FLEXTOUCH [place]), Inject 16 Units into the skin daily. 16 units daily, every 3rd day 20 units, Disp: , Rfl:    lipase/protease/amylase (CREON) 12000 units CPEP capsule, Take 36,000 Units by mouth 3 (three) times daily before meals., Disp: , Rfl:    metFORMIN (GLUCOPHAGE) 1000 MG tablet, Take 1,000 mg by mouth 2 (two) times daily with a meal. , Disp: , Rfl:    Multiple Vitamin (DAILY VITE PO), Take 1 tablet by mouth daily., Disp: , Rfl:    Multiple Vitamins-Minerals (CENTRUM ADULTS PO), , Disp: , Rfl:    pioglitazone (ACTOS) 15 MG tablet, TK 1 T PO QD, Disp: , Rfl:    rivaroxaban (XARELTO) 20 MG TABS tablet, Take by mouth., Disp: , Rfl:    tamsulosin (FLOMAX) 0.4 MG CAPS capsule, , Disp: , Rfl:    traZODone (DESYREL) 100 MG tablet, Take 1-1.5 tablets (100-150 mg total) by mouth at bedtime as needed for sleep., Disp: 135 tablet, Rfl: 1   Turmeric (QC TUMERIC COMPLEX PO), Take by mouth., Disp: , Rfl:    FLUoxetine (PROZAC) 40 MG capsule, Take 2 capsules (80 mg total) by mouth daily., Disp: 180 capsule, Rfl: 3   HYDROcodone-acetaminophen (NORCO/VICODIN) 5-325 MG tablet, Take 1 tablet by mouth every 4 (four) hours as needed. (Patient not taking: No sig reported), Disp: 10 tablet, Rfl: 0   lisinopril (PRINIVIL,ZESTRIL) 10 MG tablet, Take 10 mg by mouth daily.  (Patient not taking: No sig reported), Disp: , Rfl:    methocarbamol (ROBAXIN) 500 MG tablet, Take  1 tablet (500 mg total) by mouth 2 (two) times daily. (Patient not taking: No sig reported), Disp: 20 tablet, Rfl: 0   naltrexone (DEPADE) 50 MG tablet, Take 50 mg by mouth daily. (Patient not taking: No sig reported), Disp: , Rfl:    polyethylene glycol powder (GLYCOLAX/MIRALAX) powder, , Disp: , Rfl:    warfarin (COUMADIN) 10 MG tablet, Take 10 mg by mouth one time only at 6 PM. Take '2mg'$  tablet on all days except for Thursday, Saturday, Sunday take '3mg'$  tablet. (Patient not taking: No sig reported), Disp: , Rfl:    warfarin (COUMADIN) 2  MG tablet, , Disp: , Rfl:    warfarin (COUMADIN) 3 MG tablet, , Disp: , Rfl:  Medication Side Effects: none  Family Medical/ Social History: Changes? No   MENTAL HEALTH EXAM:  There were no vitals taken for this visit.There is no height or weight on file to calculate BMI.  General Appearance: Casual, Neat and Well Groomed  Eye Contact:  Good  Speech:  Clear and Coherent and Normal Rate  Volume:  Normal  Mood:  Euthymic  Affect:  Appropriate  Thought Process:  Goal Directed and Descriptions of Associations: Intact  Orientation:  Full (Time, Place, and Person)  Thought Content: Logical   Suicidal Thoughts:  No  Homicidal Thoughts:  No  Memory:  WNL  Judgement:  Good  Insight:  Good  Psychomotor Activity:  Normal  Concentration:  Concentration: Good and Attention Span: Good  Recall:  Good  Fund of Knowledge: Good  Language: Good  Assets:  Desire for Improvement  ADL's:  Intact  Cognition: WNL  Prognosis:  Good    DIAGNOSES:    ICD-10-CM   1. Mild recurrent major depression (HCC)  F33.0     2. Generalized anxiety disorder  F41.1     3. Insomnia, unspecified type  G47.00     4. PTSD (post-traumatic stress disorder)  F43.10        Receiving Psychotherapy: No    RECOMMENDATIONS:  PDMP was reviewed. Xanax filled 11/29/2020. I provided 30 minutes of face to face time during this encounter, including time spent before and after the visit in records review, medical decision making, and charting.  We discussed increasing the Prozac.  I think that will help with the mild depression and anxiety. Continue Xanax 0.5 mg, 1/2-1 p.o. twice daily as needed. Increase Prozac to 40 mg, 2  p.o. daily. Continue gabapentin 600 mg, 1 p.o. 3 times daily. Continue trazodone 100 mg, 1-2. nightly as needed sleep. Continue counseling with Dr. [name]. Return in 3 months.  Donnal Moat, PA-C

## 2020-12-10 ENCOUNTER — Ambulatory Visit (INDEPENDENT_AMBULATORY_CARE_PROVIDER_SITE_OTHER): Payer: Medicare PPO | Admitting: Psychiatry

## 2020-12-10 ENCOUNTER — Other Ambulatory Visit: Payer: Self-pay

## 2020-12-10 DIAGNOSIS — F411 Generalized anxiety disorder: Secondary | ICD-10-CM

## 2020-12-10 DIAGNOSIS — Z87898 Personal history of other specified conditions: Secondary | ICD-10-CM

## 2020-12-10 DIAGNOSIS — Z8659 Personal history of other mental and behavioral disorders: Secondary | ICD-10-CM

## 2020-12-10 DIAGNOSIS — F1011 Alcohol abuse, in remission: Secondary | ICD-10-CM

## 2020-12-10 DIAGNOSIS — F3341 Major depressive disorder, recurrent, in partial remission: Secondary | ICD-10-CM | POA: Diagnosis not present

## 2020-12-10 NOTE — Progress Notes (Signed)
Psychotherapy Progress Note [facility] [name], PhD LP  Patient ID: Grant Medina     MRN: [id number] Therapy format: Individual psychotherapy Date: 12/10/2020      Start: 3:07p     Stop: 3:53p     Time Spent: 46 min Location: In-person   Session narrative (presenting needs, interim history, self-report of stressors and symptoms, applications of prior therapy, status changes, and interventions made in session) Feels lately like everything he touches turns to crap.  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    Discussed communication and coached in [place] when irritated whether he has choices instead of assuming the worse.  Among other things, suggested possibility of "No, wait, let me explain" if he feels run over.  Does feel Prozac increase is beneficial overall.   Therapeutic modalities: Cognitive Behavioral Therapy and Solution-Oriented/Positive Psychology  Mental Status/Observations:  Appearance:   Casual     Behavior:  Appropriate  Motor:  Normal  Speech/Language:   Clear and Coherent  Affect:  Appropriate  Mood:  dysthymic  but better  Thought process:  normal  Thought content:    WNL  Sensory/Perceptual disturbances:    WNL  Orientation:  Fully oriented  Attention:  Good    Concentration:  Fair  Memory:  WNL  Insight:    Fair  Judgment:   Good  Impulse Control:  Good   Risk Assessment: Danger to Self: No Self-injurious Behavior: No Danger to Others: No Physical Aggression / Violence: No Duty to Warn: No Access to Firearms a concern: No  Assessment of progress:  stabilized  Diagnosis:   ICD-10-CM   1. Recurrent major depressive disorder, in partial remission ([place])  F33.41     2. Generalized anxiety disorder  F41.1     3. History of posttraumatic stress disorder  (PTSD)  Z86.59     4. History of chemical exposure  Z87.898     5. History of alcohol abuse  F10.11      Plan:  If needed, try asking wife hold up for an explanation if he feels run over.  Try to ask a question rather than go fatalistic and ruminate. Look after health habits Continue constructive communication with wife in all things as able, and when frustrated with her try to see her as a child or adolescent in need of patience and support Work appropriately and clearly with other health care on medical issues as evident Other recommendations/advice as may be noted above Continue to utilize previously learned skills ad lib Maintain medication as prescribed and work faithfully with relevant prescriber(s) if any changes are desired or seem indicated Call the clinic on-call service, present to ER, or call 911 if any life-threatening psychiatric crisis Return in about 1 month (around 01/10/2021). Already scheduled visit in this office 01/12/2021.  Blanchie Serve, PhD [name], PhD LP Clinical Psychologist, [facility] [address], [place], [place] [address] [phone]

## 2020-12-11 DIAGNOSIS — F4310 Post-traumatic stress disorder, unspecified: Secondary | ICD-10-CM | POA: Insufficient documentation

## 2020-12-11 DIAGNOSIS — G4700 Insomnia, unspecified: Secondary | ICD-10-CM | POA: Insufficient documentation

## 2020-12-11 DIAGNOSIS — F411 Generalized anxiety disorder: Secondary | ICD-10-CM | POA: Insufficient documentation

## 2020-12-11 DIAGNOSIS — F330 Major depressive disorder, recurrent, mild: Secondary | ICD-10-CM | POA: Insufficient documentation

## 2021-01-12 ENCOUNTER — Ambulatory Visit: Payer: Medicare PPO | Admitting: Psychiatry

## 2021-02-17 ENCOUNTER — Ambulatory Visit: Payer: Medicare PPO | Admitting: Psychiatry

## 2021-02-21 ENCOUNTER — Ambulatory Visit: Payer: Medicare PPO | Admitting: Psychiatry

## 2021-02-25 ENCOUNTER — Encounter (HOSPITAL_COMMUNITY): Payer: Self-pay | Admitting: *Deleted

## 2021-02-25 ENCOUNTER — Other Ambulatory Visit: Payer: Self-pay

## 2021-02-25 ENCOUNTER — Emergency Department (HOSPITAL_COMMUNITY): Payer: No Typology Code available for payment source

## 2021-02-25 ENCOUNTER — Emergency Department (HOSPITAL_COMMUNITY)
Admission: EM | Admit: 2021-02-25 | Discharge: 2021-02-25 | Disposition: A | Discharge status: Home / Self Care | Payer: No Typology Code available for payment source | Attending: Emergency Medicine | Admitting: Emergency Medicine

## 2021-02-25 DIAGNOSIS — E119 Type 2 diabetes mellitus without complications: Secondary | ICD-10-CM | POA: Diagnosis not present

## 2021-02-25 DIAGNOSIS — Z87891 Personal history of nicotine dependence: Secondary | ICD-10-CM | POA: Diagnosis not present

## 2021-02-25 DIAGNOSIS — Z85828 Personal history of other malignant neoplasm of skin: Secondary | ICD-10-CM | POA: Diagnosis not present

## 2021-02-25 DIAGNOSIS — Z794 Long term (current) use of insulin: Secondary | ICD-10-CM | POA: Insufficient documentation

## 2021-02-25 DIAGNOSIS — Z7901 Long term (current) use of anticoagulants: Secondary | ICD-10-CM | POA: Diagnosis not present

## 2021-02-25 DIAGNOSIS — Z79899 Other long term (current) drug therapy: Secondary | ICD-10-CM | POA: Diagnosis not present

## 2021-02-25 DIAGNOSIS — Z7984 Long term (current) use of oral hypoglycemic drugs: Secondary | ICD-10-CM | POA: Diagnosis not present

## 2021-02-25 DIAGNOSIS — M25531 Pain in right wrist: Secondary | ICD-10-CM | POA: Diagnosis present

## 2021-02-25 NOTE — ED Notes (Signed)
ED Provider at bedside. 
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
## 2021-02-25 NOTE — Discharge Instructions (Addendum)
Your imaging was negative for any fracture dislocations.  I am going to have you follow-up with orthopedics for further evaluation.  Tylenol for pain.  Continue using the wrist splint for comfort.  Please return to the emergency department if you experience worsening pain, weakness/numbness to your fingers or hand, or any other concerns you might have.

## 2021-02-25 NOTE — ED Provider Notes (Signed)
[facility] Provider Note   CSN: [id number] Arrival date & time: 02/25/21  1131     History Chief Complaint  Patient presents with   Wrist Pain    Grant Medina is a 70 y.o. male who presents to the emergency department for right wrist pain that has been constant and ongoing for several weeks.  He denies any injury to the area.  He self diagnosed himself with carpal tunnel.  Patient was using a leaf blower and the day after if noticed increased swelling and bleeding.  Patient is anticoagulated.  He denies any numbness or weakness to his fingers.  Pain is worse with palpation.  He rates his pain moderate in severity.  Pain is relieved with wrist brace.   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   Generalized anxiety disorder 12/11/2020   Insomnia 12/11/2020   PTSD (post-traumatic stress disorder) 12/11/2020   Type 2 diabetes mellitus ([place]) 01/11/2017    Past Surgical History:  Procedure Laterality Date   CHOLECYSTECTOMY     HERNIA REPAIR     reconstructed bile duct         Family History  Problem Relation Age of Onset   Dementia Mother    Anxiety disorder Mother    Depression Mother    CAD Mother    CAD Father    Dementia Father    Skin cancer Father    Cirrhosis Father    Anxiety disorder Father    Alcohol abuse Father    Cirrhosis Brother    Hepatitis C Brother    Depression Brother    Anxiety disorder Brother    Kidney Stones Maternal Grandmother    Alcohol abuse Paternal Grandfather    Congestive Heart Failure Paternal Grandfather    CAD Paternal Grandfather     Social  History   Tobacco Use   Smoking status: Former    Types: E-cigarettes   Smokeless tobacco: Never   Tobacco comments:    quit in 2015  Vaping Use   Vaping Use: Never used  Substance Use Topics   Alcohol use: No    Comment: quit approx 2011   Drug use: Never    Home Medications Prior to Admission medications   Medication Sig Start Date End Date Taking? Authorizing Provider  acetaminophen (TYLENOL) 500 MG tablet Take 500 mg by mouth every 4 (four) hours as needed.  01/21/17   [provider]  ALPRAZolam Duanne Moron) 0.5 MG tablet TAKE 1/2 TO 1 TABLET BY MOUTH TWICE DAILY AS NEEDED FOR ANXIETY 11/29/20   Hurst, Helene Kelp T, PA-C  B Complex-Biotin-FA (B-COMPLEX PO) Take 1 tablet by mouth daily.    [provider]  FLUoxetine (PROZAC) 40 MG capsule Take 2 capsules (80 mg total) by mouth daily. 12/08/20   Donnal Moat T, PA-C  gabapentin (NEURONTIN) 600 MG tablet Take 600 mg by mouth 3 (three) times daily.    [provider]  HYDROcodone-acetaminophen (NORCO/VICODIN) 5-325 MG tablet Take 1 tablet by mouth every 4 (four) hours as  needed. Patient not taking: No sig reported 03/31/18   Fransico Meadow, PA-C  Insulin Degludec (TRESIBA FLEXTOUCH [place]) Inject 16 Units into the skin daily. 16 units daily, every 3rd day 20 units    [provider]  lipase/protease/amylase (CREON) 12000 units CPEP capsule Take 36,000 Units by mouth 3 (three) times daily before meals.    [provider]  lisinopril (PRINIVIL,ZESTRIL) 10 MG tablet Take 10 mg by mouth daily.  Patient not taking: No sig reported 04/23/17   [provider]  metFORMIN (GLUCOPHAGE) 1000 MG tablet Take 1,000 mg by mouth 2 (two) times daily with a meal.  04/23/17   [provider]  methocarbamol (ROBAXIN) 500 MG tablet Take 1 tablet (500 mg total) by mouth 2 (two) times daily. Patient not taking: No sig reported 03/31/18   Fransico Meadow, PA-C  Multiple Vitamin (DAILY VITE PO) Take 1 tablet by  mouth daily.    [provider]  Multiple Vitamins-Minerals (CENTRUM ADULTS PO)  01/21/17   [provider]  naltrexone (DEPADE) 50 MG tablet Take 50 mg by mouth daily. Patient not taking: No sig reported    [provider]  pioglitazone (ACTOS) 15 MG tablet TK 1 T PO QD 12/06/18   [provider]  polyethylene glycol powder (GLYCOLAX/MIRALAX) powder  04/03/17   [provider]  rivaroxaban (XARELTO) 20 MG TABS tablet Take by mouth. 10/07/18   [provider]  tamsulosin (FLOMAX) 0.4 MG CAPS capsule  11/19/18   [provider]  traZODone (DESYREL) 100 MG tablet Take 1-1.5 tablets (100-150 mg total) by mouth at bedtime as needed for sleep. 12/08/20   Addison Lank, PA-C  Turmeric (QC TUMERIC COMPLEX PO) Take by mouth.    [provider]  warfarin (COUMADIN) 10 MG tablet Take 10 mg by mouth one time only at 6 PM. Take 2mg  tablet on all days except for Thursday, Saturday, Sunday take 3mg  tablet. Patient not taking: No sig reported 04/19/17   [provider]  warfarin (COUMADIN) 2 MG tablet  03/27/17   [provider]  warfarin (COUMADIN) 3 MG tablet  02/27/17   [provider]    Allergies    Patient has no known allergies.  Review of Systems   Review of Systems  All other systems reviewed and are negative.  Physical Exam Updated Vital Signs BP (!) 162/80 (BP Location: Left Arm)   Pulse 98   Temp 97.6 F (36.4 C) (Oral)   Resp 16   SpO2 97%   Physical Exam Vitals and nursing note reviewed.  Constitutional:      Appearance: Normal appearance.  HENT:     Head: Normocephalic and atraumatic.  Eyes:     General:        Right eye: No discharge.        Left eye: No discharge.     Conjunctiva/sclera: Conjunctivae normal.  Pulmonary:     Effort: Pulmonary effort is normal.  Musculoskeletal:     Comments: The volar aspect of the right wrist has old healing ecchymosis.  No obvious swelling or  deformity.  Area is tender to palpation.  He has normal range of motion of the fingers.  Good cap refill to fingers.  2+ radial pulse on the right.  Skin:    General: Skin is warm and dry.     Findings: No rash.  Neurological:     General: No focal deficit present.     Mental Status:  He is alert.  Psychiatric:        Mood and Affect: Mood normal.        Behavior: Behavior normal.    ED Results / Procedures / Treatments   Labs (all labs ordered are listed, but only abnormal results are displayed) Labs Reviewed - No data to display  EKG None  Radiology DG Wrist Complete Right  Result Date: 02/25/2021 CLINICAL DATA:  Right wrist pain for 1 month. EXAM: RIGHT WRIST - COMPLETE 3+ VIEW COMPARISON:  None. FINDINGS: No acute fracture. Deformity of the fifth metacarpal reflects an old, healed fracture. Mild narrowing and minor subchondral sclerosis at the scaphoid trapezium trapezoid articulation. Remaining joints normally spaced and aligned. Soft tissues are unremarkable. IMPRESSION: 1. No acute fracture or dislocation. 2. Mild degenerative changes at the scaphoid trapezium trapezoid articulation. Electronically Signed   By: Lajean Manes M.D.   On: 02/25/2021 12:47    Procedures Procedures   Medications Ordered in ED Medications - No data to display  ED Course  I have reviewed the triage vital signs and the nursing notes.  Pertinent labs & imaging results that were available during my care of the patient were reviewed by me and considered in my medical decision making (see chart for details).  Clinical Course as of 02/25/21 1300  Fri Feb 25, 2021  1257 I discussed this case with my attending physician who cosigned this note including patient's presenting symptoms, physical exam, and planned diagnostics and interventions. Attending physician stated agreement with plan or made changes to plan which were implemented.    [CF]    Clinical Course User Index [CF] Cherrie Gauze    MDM Rules/Calculators/A&P                          Grant Medina is a 70 y.o. male who presents to the emergency department for further evaluation of right wrist pain.  Imaging did not reveal any fracture dislocations.  I am not exactly sure the cause of his wrist pain however I have low suspicion for any significant ligamentous injury at this time.  He is neurovascularly intact in the hand and fingers.  I will have him follow-up with orthopedics for further evaluation.  Patient has a wrist splint already. He is safe for discharge.   Final Clinical Impression(s) / ED Diagnoses Final diagnoses:  Right wrist pain    Rx / DC Orders ED Discharge Orders     None        Cherrie Gauze 02/25/21 1303    Carmin Muskrat, MD 03/02/21 1031

## 2021-03-10 ENCOUNTER — Ambulatory Visit: Payer: Medicare PPO | Admitting: Physician Assistant

## 2021-03-29 ENCOUNTER — Ambulatory Visit (INDEPENDENT_AMBULATORY_CARE_PROVIDER_SITE_OTHER): Payer: Medicare PPO | Admitting: Physician Assistant

## 2021-03-29 ENCOUNTER — Encounter: Payer: Self-pay | Admitting: Physician Assistant

## 2021-03-29 ENCOUNTER — Other Ambulatory Visit: Payer: Self-pay

## 2021-03-29 DIAGNOSIS — F3341 Major depressive disorder, recurrent, in partial remission: Secondary | ICD-10-CM

## 2021-03-29 DIAGNOSIS — G4700 Insomnia, unspecified: Secondary | ICD-10-CM | POA: Diagnosis not present

## 2021-03-29 DIAGNOSIS — F411 Generalized anxiety disorder: Secondary | ICD-10-CM | POA: Diagnosis not present

## 2021-03-29 DIAGNOSIS — F4310 Post-traumatic stress disorder, unspecified: Secondary | ICD-10-CM

## 2021-03-29 NOTE — Progress Notes (Signed)
[facility] Med Check  Patient ID: EDGEL DEGNAN,  MRN: [id number]  PCP: Esperanza Sheets, FNP  Date of Evaluation: 03/29/2021  time spent:20 minutes  Chief Complaint:  Chief Complaint   Anxiety; Depression; Insomnia; Follow-up      HISTORY/CURRENT STATUS: HPI for f/u med check.   At the last visit 3 months ago we increased the Prozac.  States he has been doing really well.  Able to enjoy things.  Energy and motivation are good.  He sleeps well, usually 9 to 10 hours every night.  Does take the trazodone, without it he has a hard time falling asleep.  Anxiety is a lot better since bumping up the Prozac.  Does not really have panic attacks but more so of a generalized sense of unease.  He does need to take the Xanax daily and it is helpful.  Appetite is normal and weight is stable.  Denies suicidal or homicidal thoughts.  Patient denies increased energy with decreased need for sleep, no increased talkativeness, no racing thoughts, no impulsivity or risky behaviors, no increased spending, no increased libido, no grandiosity, no increased irritability or anger, and no hallucinations.  Denies dizziness, syncope, seizures, numbness, tingling, tremor, tics, unsteady gait, slurred speech, confusion. Denies muscle or joint pain, stiffness, or dystonia.  Individual Medical History/ Review of Systems: Changes? :No    Past medications for mental health diagnoses include: Xanax, Prozac, Ambien, Effexor, Wellbutrin  Allergies: Patient has no known allergies.  Current Medications:  Current Outpatient Medications:    acetaminophen (TYLENOL) 500 MG tablet, Take 500 mg by mouth every 4 (four) hours as needed. , Disp: , Rfl:    ALPRAZolam (XANAX) 0.5 MG tablet, TAKE 1/2 TO 1 TABLET BY MOUTH TWICE DAILY AS NEEDED FOR ANXIETY, Disp: 60 tablet, Rfl: 5   B Complex-Biotin-FA (B-COMPLEX PO), Take 1 tablet by mouth daily., Disp: , Rfl:    FLUoxetine (PROZAC) 40 MG capsule, Take 2 capsules (80 mg  total) by mouth daily., Disp: 180 capsule, Rfl: 3   gabapentin (NEURONTIN) 600 MG tablet, Take 600 mg by mouth 3 (three) times daily., Disp: , Rfl:    Insulin Degludec (TRESIBA FLEXTOUCH [place]), Inject 16 Units into the skin daily. 16 units daily, every 3rd day 20 units, Disp: , Rfl:    lipase/protease/amylase (CREON) 12000 units CPEP capsule, Take 36,000 Units by mouth 3 (three) times daily before meals., Disp: , Rfl:    metFORMIN (GLUCOPHAGE) 1000 MG tablet, Take 1,000 mg by mouth 2 (two) times daily with a meal. , Disp: , Rfl:    Multiple Vitamin (DAILY VITE PO), Take 1 tablet by mouth daily., Disp: , Rfl:    Multiple Vitamins-Minerals (CENTRUM ADULTS PO), , Disp: , Rfl:    rivaroxaban (XARELTO) 20 MG TABS tablet, Take by mouth., Disp: , Rfl:    tamsulosin (FLOMAX) 0.4 MG CAPS capsule, , Disp: , Rfl:    traZODone (DESYREL) 100 MG tablet, Take 1-1.5 tablets (100-150 mg total) by mouth at bedtime as needed for sleep., Disp: 135 tablet, Rfl: 1   Turmeric (QC TUMERIC COMPLEX PO), Take by mouth., Disp: , Rfl:    HYDROcodone-acetaminophen (NORCO/VICODIN) 5-325 MG tablet, Take 1 tablet by mouth every 4 (four) hours as needed. (Patient not taking: Reported on 10/31/2018), Disp: 10 tablet, Rfl: 0   lisinopril (PRINIVIL,ZESTRIL) 10 MG tablet, Take 10 mg by mouth daily.  (Patient not taking: Reported on 12/01/2019), Disp: , Rfl:    methocarbamol (ROBAXIN) 500 MG tablet, Take 1 tablet (500  mg total) by mouth 2 (two) times daily. (Patient not taking: Reported on 10/31/2018), Disp: 20 tablet, Rfl: 0   naltrexone (DEPADE) 50 MG tablet, Take 50 mg by mouth daily. (Patient not taking: Reported on 12/01/2019), Disp: , Rfl:    pioglitazone (ACTOS) 15 MG tablet, TK 1 T PO QD, Disp: , Rfl:    polyethylene glycol powder (GLYCOLAX/MIRALAX) powder, , Disp: , Rfl:    warfarin (COUMADIN) 10 MG tablet, Take 10 mg by mouth one time only at 6 PM. Take 2mg  tablet on all days except for Thursday, Saturday, Sunday take 3mg  tablet.  (Patient not taking: Reported on 12/01/2019), Disp: , Rfl:    warfarin (COUMADIN) 2 MG tablet, , Disp: , Rfl:    warfarin (COUMADIN) 3 MG tablet, , Disp: , Rfl:  Medication Side Effects: none  Family Medical/ Social History: Changes? No   MENTAL HEALTH EXAM:  There were no vitals taken for this visit.There is no height or weight on file to calculate BMI.  General Appearance: Casual, Neat and Well Groomed  Eye Contact:  Good  Speech:  Clear and Coherent and Normal Rate  Volume:  Normal  Mood:  Euthymic  Affect:  Appropriate  Thought Process:  Goal Directed and Descriptions of Associations: Circumstantial  Orientation:  Full (Time, Place, and Person)  Thought Content: Logical   Suicidal Thoughts:  No  Homicidal Thoughts:  No  Memory:  WNL  Judgement:  Good  Insight:  Good  Psychomotor Activity:  Normal  Concentration:  Concentration: Good and Attention Span: Good  Recall:  Good  Fund of Knowledge: Good  Language: Good  Assets:  Desire for Improvement  ADL's:  Intact  Cognition: WNL  Prognosis:  Good    DIAGNOSES:    ICD-10-CM   1. Recurrent major depressive disorder, in partial remission ([place])  F33.41     2. Generalized anxiety disorder  F41.1     3. Insomnia, unspecified type  G47.00     4. PTSD (post-traumatic stress disorder)  F43.10         Receiving Psychotherapy: No    RECOMMENDATIONS:  PDMP was reviewed. Xanax filled 03/03/2021. I provided 20 minutes of face to face time during this encounter, including time spent before and after the visit in records review, medical decision making, counseling pertinent to today's visit, and charting.  He is doing well so no changes will be made. He is aware of the possible increased risk of confusion, dizziness, falls related to benzodiazepine use.  States that is the only thing that helps keep him calm and he accepts those risks. Continue Xanax 0.5 mg, 1/2-1 p.o. twice daily as needed. Continue Prozac 40 mg, 2  p.o.  daily. Continue gabapentin 600 mg, 1 p.o. 3 times daily. ([facility]) Continue trazodone 100 mg, 1-2. nightly as needed sleep. Return in 6 months.  Donnal Moat, PA-C

## 2021-05-05 ENCOUNTER — Other Ambulatory Visit: Payer: Self-pay | Admitting: Physician Assistant

## 2021-05-30 ENCOUNTER — Other Ambulatory Visit: Payer: Self-pay | Admitting: Physician Assistant

## 2021-06-23 ENCOUNTER — Other Ambulatory Visit: Payer: Self-pay

## 2021-06-23 ENCOUNTER — Ambulatory Visit (INDEPENDENT_AMBULATORY_CARE_PROVIDER_SITE_OTHER): Payer: Medicare PPO | Admitting: Psychiatry

## 2021-06-23 DIAGNOSIS — F3341 Major depressive disorder, recurrent, in partial remission: Secondary | ICD-10-CM | POA: Diagnosis not present

## 2021-06-23 DIAGNOSIS — Z8659 Personal history of other mental and behavioral disorders: Secondary | ICD-10-CM | POA: Diagnosis not present

## 2021-06-23 DIAGNOSIS — F1011 Alcohol abuse, in remission: Secondary | ICD-10-CM | POA: Diagnosis not present

## 2021-06-23 DIAGNOSIS — Z794 Long term (current) use of insulin: Secondary | ICD-10-CM

## 2021-06-23 DIAGNOSIS — Z87898 Personal history of other specified conditions: Secondary | ICD-10-CM | POA: Diagnosis not present

## 2021-06-23 DIAGNOSIS — E1140 Type 2 diabetes mellitus with diabetic neuropathy, unspecified: Secondary | ICD-10-CM

## 2021-06-23 NOTE — Progress Notes (Signed)
Psychotherapy Progress Note ?[facility] ?[name], PhD LP ? ?Patient ID: Grant Medina)    MRN: [id number] ?Therapy format: Family therapy w/ patient -- accompanied by Oda Kilts ?Date: 06/23/2021      Start: 3:22p     Stop: 4:10p     Time Spent: 48 min ?Location: In-person  ? ?Session narrative (presenting needs, interim history, self-report of stressors and symptoms, applications of prior therapy, status changes, and interventions made in session) ?C/o feeling old, being more forgetful, more vulnerable to distraction.  Typically 8 hrs sleep, sometimes 10.  Always dozes off another 30-40 min after morning meds.  Feeling less able to handle yard equipment.  Dog passed away a year ago.  Friend of Kathy's, [place], moved in with them Oct last year to last month, had two engaging dogs Tyronn misses but was also apparently codependent, got irritating.    ? ?No suspected vitamin deficiencies -- supplements include senior MVI, fish oil, Super B complex, turmeric, and D3.  No suspicions of sleep apnea, no noted snoring, though it could still be central apnea.  Morning grogginess, probably associated with trazodone hangover.  Plenty hydrated, chest xrays regularly clean.  Diabetic, though only the last few years has he had stable treatment.  About once a week or two will have a bottom where he needs to use a sugar pill.   ? ?Interpreted combination of bereavement, negativistic thought patterns, and unspecified physiological factors most likely one or more of degraded sleep, metabolic sluggishness (e.g., insulin resistance), trazodone hangover, and deconditioning.  Possible role of past toxic chemical exposure, but the others are addressable if present.  Discussed measures he can take to better tell and better control them. ? ?Therapeutic modalities: Cognitive Behavioral Therapy and Solution-Oriented/Positive Psychology ? ?Mental Status/Observations: ? ?Appearance:   Casual     ?Behavior:   Appropriate, episodes of crabbing at wife  ?Motor:  Normal  ?Speech/Language:   Clear and Coherent  ?Affect:  Appropriate  ?Mood:  dysthymic  ?Thought process:  normal  ?Thought content:    negativistic  ?Sensory/Perceptual disturbances:    WNL  ?Orientation:  Fully oriented  ?Attention:  Good  ?  ?Concentration:  Fair  ?Memory:  grossly intact on observation  ?Insight:    Fair  ?Judgment:   Good  ?Impulse Control:  Fair  ? ?Risk Assessment: ?Danger to Self: No Self-injurious Behavior: No ?Danger to Others: No Physical Aggression / Violence: No ?Duty to Warn: No Access to Firearms a concern: No ? ?Assessment of progress:  stabilized ? ?Diagnosis: ?  ICD-10-CM   ?1. Recurrent major depressive disorder, in partial remission ([place])  F33.41   ?  ?2. History of posttraumatic stress disorder (PTSD)  Z86.59   ?  ?3. History of chemical exposure  Z87.898   ?  ?4. History of alcohol abuse  F10.11   ?  ?5. Type 2 diabetes mellitus with diabetic neuropathy, with long-term current use of insulin (HCC)  E11.40   ? Z79.4   ?  ? ?Plan:  ?Recommend lightening up trazodone dose -- work in the 50-100mg  range -- to see if it will lift morning somnolence ?Option to get MCT oil and use it for energy rescues when b.s. is not bottoming.  Option to use it before resorting to sugar ?For Kathy's interest in ADHD therapy (suggested related to hx of CVA), refer to [facility]  ?Option to get a new dog, depending on interest vs. responsibilities ?In  any event, get enough movement and light to stimulate ?Other recommendations/advice as may be noted above ?Continue to utilize previously learned skills ad lib ?Maintain medication as prescribed and work faithfully with relevant prescriber(s) if any changes are desired or seem indicated ?Call the clinic on-call service, 988/hotline, 911, or present to [facility] or ER if any life-threatening psychiatric crisis ?Return in about 3 months (around 09/23/2021) for available earlier @ PT's  need. ?Already scheduled visit in this office 09/27/2021. ? ?Blanchie Serve, PhD ?[name], PhD LP ?Clinical Psychologist, [facility] ?[facility] ?[address] ?[place], [place] [address] ?(o) [phone] ?

## 2021-09-20 ENCOUNTER — Ambulatory Visit (INDEPENDENT_AMBULATORY_CARE_PROVIDER_SITE_OTHER): Payer: Medicare PPO | Admitting: Psychiatry

## 2021-09-20 DIAGNOSIS — Z87898 Personal history of other specified conditions: Secondary | ICD-10-CM

## 2021-09-20 DIAGNOSIS — F3341 Major depressive disorder, recurrent, in partial remission: Secondary | ICD-10-CM

## 2021-09-20 DIAGNOSIS — G4700 Insomnia, unspecified: Secondary | ICD-10-CM

## 2021-09-20 DIAGNOSIS — F1011 Alcohol abuse, in remission: Secondary | ICD-10-CM

## 2021-09-20 DIAGNOSIS — E1140 Type 2 diabetes mellitus with diabetic neuropathy, unspecified: Secondary | ICD-10-CM

## 2021-09-20 DIAGNOSIS — Z8659 Personal history of other mental and behavioral disorders: Secondary | ICD-10-CM

## 2021-09-20 DIAGNOSIS — Z794 Long term (current) use of insulin: Secondary | ICD-10-CM

## 2021-09-20 NOTE — Progress Notes (Signed)
Psychotherapy Progress Note [facility] [name], PhD LP  Patient ID: KEDRON UNO [facility])    MRN: [id number] Therapy format: Individual psychotherapy Date: 09/20/2021      Start: 11:12a     Stop: 11:42a     Time Spent: 30 min Location: In-person   Session narrative (presenting needs, interim history, self-report of stressors and symptoms, applications of prior therapy, status changes, and interventions made in session) Generally OK, just relaxing, not awful distressed.  Still hard on self, mostly about aging.  Sleep fine, bed 8/9pm to 6a.  Usually lies down a bit after coffee, not fully awake until 11am.  New A/C and cleaning pipes, gutters helpful.  Found a good yard man to trim bushes.  Still cutting his own grass.  Taking care of [name], who stays active with church.  Settled in [place] area, no complaints.  Getting along well with wife, cares for her as she deals with hip deterioration, a digestive disturbance, and sequelae of Guillain-Barre.  Moving trazodone back helped morning hangover.  Ran out of super B complex, felt the difference a week later, restored it.  Diabetes well enough controlled now that he doesn't have to resort to a glucose pill.  Hasn't gone for MCT oil, but does tend to start the day with protein, per conventional recommendations.  Not interested in pushing further into daily metabolism, content with the slow warm through the morning.  Decided against getting a new dog yet, feels it wouldn't be fair to the dog.  Juliann Pulse has an appt for ADHD (related to her [facility] hx).  Good news she is keeping similar hours to his with sleep now.    Therapeutic modalities: Cognitive Behavioral Therapy, Solution-Oriented/Positive Psychology, and Ego-Supportive  Mental Status/Observations:  Appearance:   Casual     Behavior:  Appropriate  Motor:  Normal  Speech/Language:   Clear and Coherent  Affect:  Appropriate  Mood:  normal  Thought process:  normal  Thought  content:    WNL  Sensory/Perceptual disturbances:    WNL  Orientation:  Fully oriented  Attention:  Good    Concentration:  Good  Memory:  WNL  Insight:    Fair  Judgment:   Good  Impulse Control:  Good   Risk Assessment: Danger to Self: No Self-injurious Behavior: No Danger to Others: No Physical Aggression / Violence: No Duty to Warn: No Access to Firearms a concern: No  Assessment of progress:  progressing  Diagnosis:   ICD-10-CM   1. Recurrent major depressive disorder, in partial remission ([place])  F33.41     2. Insomnia, unspecified type  G47.00     3. History of posttraumatic stress disorder (PTSD)  Z86.59     4. History of chemical exposure  Z87.898     5. History of alcohol abuse  F10.11     6. Type 2 diabetes mellitus with diabetic neuropathy, with long-term current use of insulin (HCC)  E11.40    Z79.4      Plan:  Tend good supply of natural light, fresh air, and sleep regulation Continue nutritional self-care Continue positive companionship with wife At option, learn more from [place] about what she means by being hard on himself.  Option write down "hard on myself" thoughts, read them as if saying them to a son/brother/friend, decide whether he'd say it differently, and then say it that way to self Other recommendations/advice as may be noted above Continue to utilize previously learned skills ad lib Maintain  medication as prescribed and work faithfully with relevant prescriber(s) if any changes are desired or seem indicated Call the clinic on-call service, 988/hotline, 911, or present to [facility] or ER if any life-threatening psychiatric crisis Return for time as available. Already scheduled visit in this office 09/27/2021.  Blanchie Serve, PhD [name], PhD LP Clinical Psychologist, [facility] [address], [place], [place] [address] [phone]

## 2021-09-27 ENCOUNTER — Ambulatory Visit (INDEPENDENT_AMBULATORY_CARE_PROVIDER_SITE_OTHER): Payer: Medicare PPO | Admitting: Physician Assistant

## 2021-09-27 ENCOUNTER — Encounter: Payer: Self-pay | Admitting: Physician Assistant

## 2021-09-27 DIAGNOSIS — F411 Generalized anxiety disorder: Secondary | ICD-10-CM | POA: Diagnosis not present

## 2021-09-27 DIAGNOSIS — G4700 Insomnia, unspecified: Secondary | ICD-10-CM | POA: Diagnosis not present

## 2021-09-27 DIAGNOSIS — F3342 Major depressive disorder, recurrent, in full remission: Secondary | ICD-10-CM

## 2021-09-27 MED ORDER — ALPRAZOLAM 0.5 MG PO TABS: 0.2500 mg | ORAL_TABLET | Freq: Two times a day (BID) | ORAL | 5 refills | Status: DC | PRN

## 2021-09-27 MED ORDER — TRAZODONE HCL 100 MG PO TABS: 100.0000 mg | ORAL_TABLET | Freq: Every day | ORAL | 3 refills | Status: DC

## 2021-09-27 NOTE — Progress Notes (Signed)
[facility]  Patient ID: Grant Medina,  MRN: [id number]  PCP: Esperanza Sheets, FNP  Date of Evaluation: 09/27/2021 time spent:20 minutes  Chief Complaint:  Chief Complaint   Anxiety; Depression; Follow-up    HISTORY/CURRENT STATUS: HPI for f/u med check.   Doing well with mental health. He enjoys doing things around the house like cut grass. Unable to do as much as he used to, like clean gutters, etc. He takes Xarelto and he bruises easy and sometimes bleeds easy if he gets cut, so he doesn't do things that might put him at risk.  ADLs and personal hygiene are normal.  He does most of the housework b/c his wife isn't as able now, she needs hip replacement. Appetite has not changed.  Weight is stable.  No extreme sadness, tearfulness, or feelings of hopelessness.  Sleeps well, uses Trazodone routinely.  Denies any changes in making decisions or remembering things.  Denies suicidal or homicidal thoughts.  Anxiety is well-controlled. Needs Xanax daily and it still helps. If he doesn't take it, he has an overwhelming sense of unease, like something bad is going to happen. No PA. He sometimes can't turn his thoughts off to go to sleep, and the Xanax helps that.  Denies dizziness, syncope, seizures, numbness, tingling, tremor, tics, unsteady gait, falls, slurred speech, or confusion. Denies muscle or joint pain, stiffness, or dystonia.  Individual Medical History/ Review of Systems: Changes? :No    Past medications for mental health diagnoses include: Xanax, Prozac, Ambien, Effexor, Wellbutrin  Allergies: Patient has no known allergies.  Current Medications:  Current Outpatient Medications:    acetaminophen (TYLENOL) 500 MG tablet, Take 500 mg by mouth every 4 (four) hours as needed. , Disp: , Rfl:    B Complex-Biotin-FA (B-COMPLEX PO), Take 1 tablet by mouth daily., Disp: , Rfl:    FLUoxetine (PROZAC) 40 MG capsule, Take 2 capsules (80 mg total) by mouth daily., Disp:  180 capsule, Rfl: 3   gabapentin (NEURONTIN) 600 MG tablet, Take 600 mg by mouth 3 (three) times daily., Disp: , Rfl:    Insulin Degludec (TRESIBA FLEXTOUCH [place]), Inject 16 Units into the skin daily. 16 units daily, every 3rd day 20 units, Disp: , Rfl:    lipase/protease/amylase (CREON) 12000 units CPEP capsule, Take 36,000 Units by mouth 3 (three) times daily before meals., Disp: , Rfl:    metFORMIN (GLUCOPHAGE) 1000 MG tablet, Take 1,000 mg by mouth 2 (two) times daily with a meal. , Disp: , Rfl:    methocarbamol (ROBAXIN) 500 MG tablet, Take 1 tablet (500 mg total) by mouth 2 (two) times daily., Disp: 20 tablet, Rfl: 0   Multiple Vitamin (DAILY VITE PO), Take 1 tablet by mouth daily., Disp: , Rfl:    Multiple Vitamins-Minerals (CENTRUM ADULTS PO), , Disp: , Rfl:    pioglitazone (ACTOS) 15 MG tablet, TK 1 T PO QD, Disp: , Rfl:    rivaroxaban (XARELTO) 20 MG TABS tablet, Take by mouth., Disp: , Rfl:    tamsulosin (FLOMAX) 0.4 MG CAPS capsule, , Disp: , Rfl:    Turmeric (QC TUMERIC COMPLEX PO), Take by mouth., Disp: , Rfl:    ALPRAZolam (XANAX) 0.5 MG tablet, Take 0.5-1 tablets (0.25-0.5 mg total) by mouth 2 (two) times daily as needed. for anxiety, Disp: 60 tablet, Rfl: 5   lisinopril (PRINIVIL,ZESTRIL) 10 MG tablet, Take 10 mg by mouth daily.  (Patient not taking: Reported on 12/01/2019), Disp: , Rfl:    naltrexone (DEPADE) 50  MG tablet, Take 50 mg by mouth daily. (Patient not taking: Reported on 12/01/2019), Disp: , Rfl:    polyethylene glycol powder (GLYCOLAX/MIRALAX) powder, , Disp: , Rfl:    traZODone (DESYREL) 100 MG tablet, Take 1-2 tablets (100-200 mg total) by mouth at bedtime., Disp: 180 tablet, Rfl: 3   warfarin (COUMADIN) 10 MG tablet, Take 10 mg by mouth one time only at 6 PM. Take '2mg'$  tablet on all days except for Thursday, Saturday, Sunday take '3mg'$  tablet. (Patient not taking: Reported on 12/01/2019), Disp: , Rfl:    warfarin (COUMADIN) 2 MG tablet, , Disp: , Rfl:    warfarin (COUMADIN) 3  MG tablet, , Disp: , Rfl:  Medication Side Effects: none  Family Medical/ Social History: Changes? No   MENTAL HEALTH EXAM:  There were no vitals taken for this visit.There is no height or weight on file to calculate BMI.  General Appearance: Casual, Neat, Well Groomed, and several dark purplish bruises on forearms bilat.  Eye Contact:  Good  Speech:  Clear and Coherent and Normal Rate  Volume:  Normal  Mood:  Euthymic  Affect:  Appropriate  Thought Process:  Goal Directed and Descriptions of Associations: Circumstantial  Orientation:  Full (Time, Place, and Person)  Thought Content: Logical   Suicidal Thoughts:  No  Homicidal Thoughts:  No  Memory:  WNL  Judgement:  Good  Insight:  Good  Psychomotor Activity:  Normal  Concentration:  Concentration: Good and Attention Span: Good  Recall:  Good  Fund of Knowledge: Good  Language: Good  Assets:  Desire for Improvement  ADL's:  Intact  Cognition: WNL  Prognosis:  Good    DIAGNOSES:    ICD-10-CM   1. Recurrent major depressive disorder, in full remission ([place])  F33.42     2. Generalized anxiety disorder  F41.1     3. Insomnia, unspecified type  G47.00      Receiving Psychotherapy: No    RECOMMENDATIONS:  PDMP was reviewed. Xanax filled 09/24/2021. I provided 20 minutes of face to face time during this encounter, including time spent before and after the visit in records review, medical decision making, counseling pertinent to today's visit, and charting.  I am glad to see him doing well.  No changes will be made. We discussed the increased risk of confusion, dizziness, falls related to benzodiazepine use.  States that is the only thing that helps keep him calm and he accepts those risks.  Continue Xanax 0.5 mg, 1/2-1 p.o. twice daily as needed. Continue Prozac 40 mg, 2  p.o. daily. Continue gabapentin 600 mg, 1 p.o. 3 times daily. ([facility]) Continue trazodone 100 mg, 1-2. nightly as needed sleep. Continue B  complex, multivitamin, vitamin D, fish oil. Return in 6 months.  Donnal Moat, PA-C

## 2021-10-05 ENCOUNTER — Other Ambulatory Visit: Payer: Self-pay | Admitting: Physician Assistant

## 2022-01-05 ENCOUNTER — Other Ambulatory Visit: Payer: Self-pay | Admitting: Physician Assistant

## 2022-01-21 IMAGING — DX DG WRIST COMPLETE 3+V*R*
3 series · 3 of 3 positions shown · non-contrast
Comparison: None.

CLINICAL DATA: Right wrist pain for 1 month.

EXAM:
RIGHT WRIST - COMPLETE 3+ VIEW

[wrist ap]
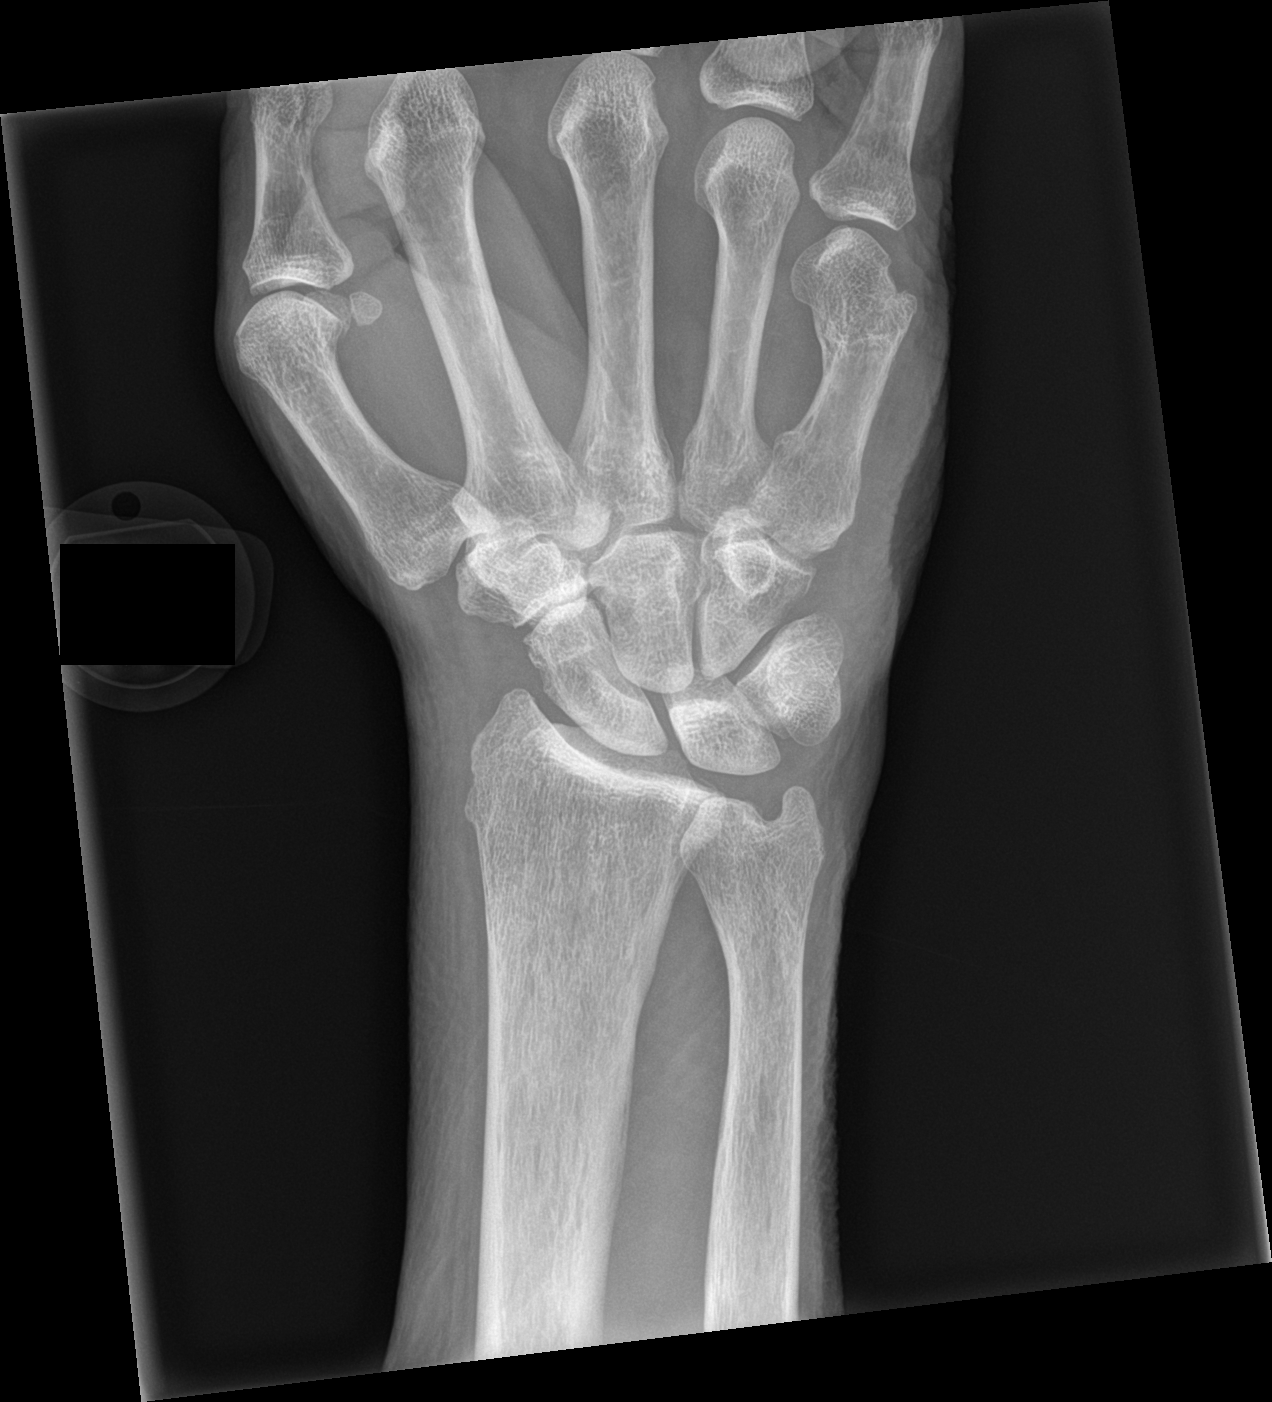

[wrist obl]
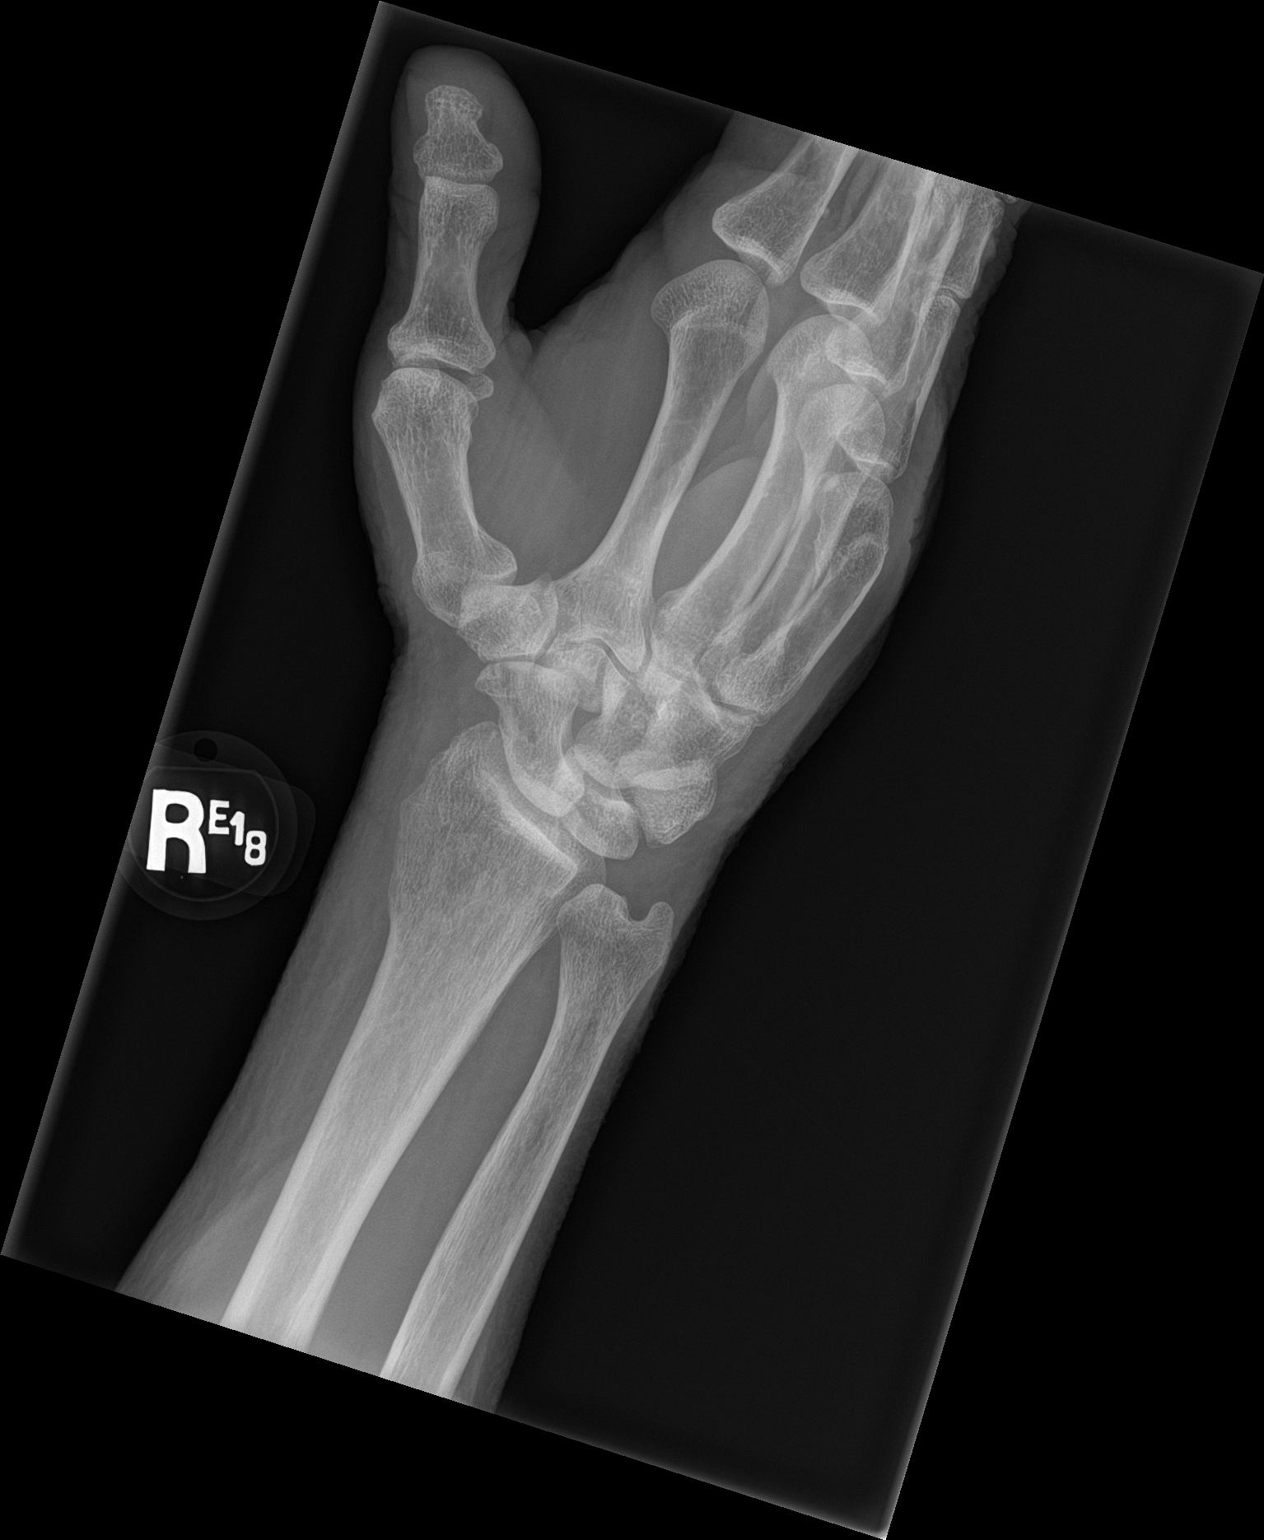

[wrist lat]
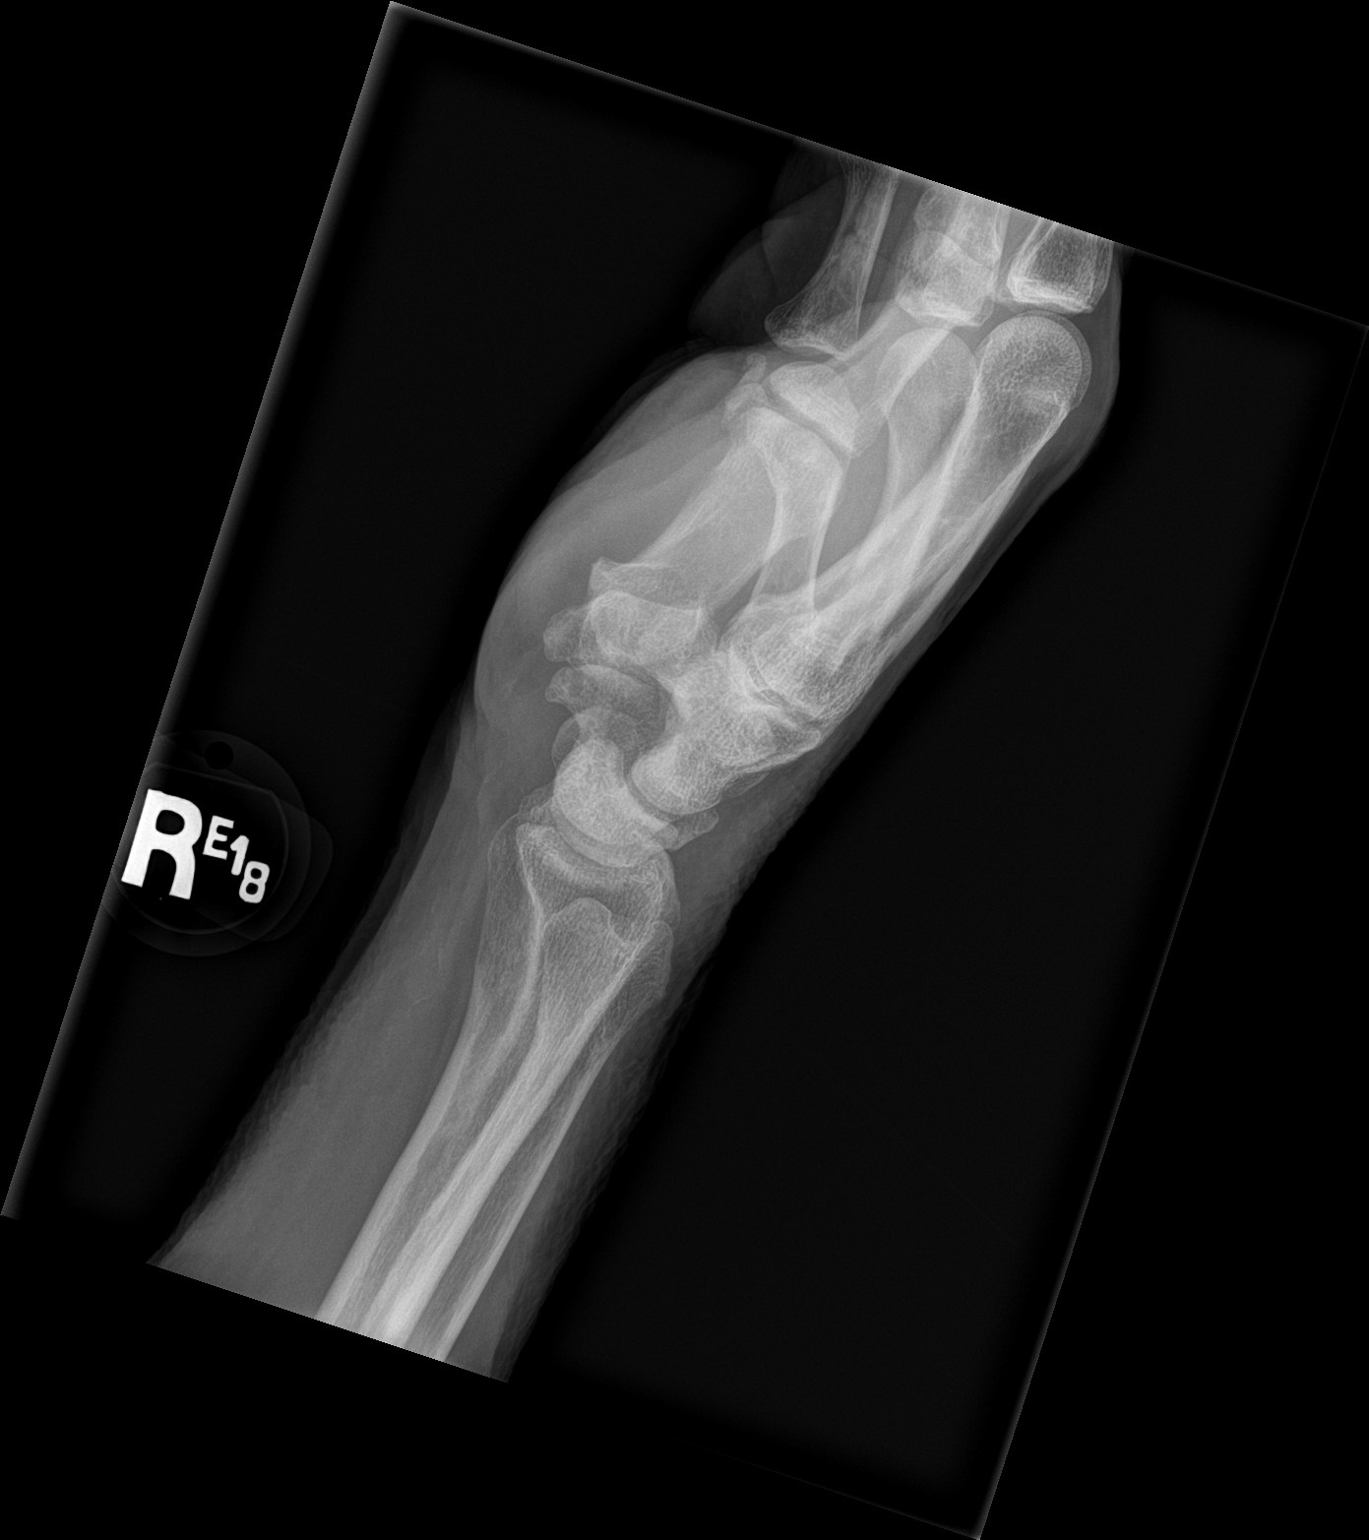

[3 of 3 positions shown; findings below may reference images not displayed]

FINDINGS: No acute fracture.

Deformity of the fifth metacarpal reflects an old, healed fracture.

Mild narrowing and minor subchondral sclerosis at the scaphoid
trapezium trapezoid articulation. Remaining joints normally spaced
and aligned.

Soft tissues are unremarkable.
IMPRESSION: 1. No acute fracture or dislocation.
2. Mild degenerative changes at the scaphoid trapezium trapezoid
articulation.

## 2022-01-23 ENCOUNTER — Ambulatory Visit (INDEPENDENT_AMBULATORY_CARE_PROVIDER_SITE_OTHER): Payer: Medicare PPO | Admitting: Psychiatry

## 2022-01-23 DIAGNOSIS — F1011 Alcohol abuse, in remission: Secondary | ICD-10-CM

## 2022-01-23 DIAGNOSIS — F411 Generalized anxiety disorder: Secondary | ICD-10-CM

## 2022-01-23 DIAGNOSIS — Z8659 Personal history of other mental and behavioral disorders: Secondary | ICD-10-CM | POA: Diagnosis not present

## 2022-01-23 DIAGNOSIS — Z87898 Personal history of other specified conditions: Secondary | ICD-10-CM | POA: Diagnosis not present

## 2022-01-23 DIAGNOSIS — F3342 Major depressive disorder, recurrent, in full remission: Secondary | ICD-10-CM | POA: Diagnosis not present

## 2022-01-23 DIAGNOSIS — R69 Illness, unspecified: Secondary | ICD-10-CM

## 2022-01-23 NOTE — Progress Notes (Signed)
Psychotherapy Progress Note [facility] [name], PhD LP  Patient ID: PEGGY MONK [facility])    MRN: [id number] Therapy format: Individual psychotherapy Date: 01/23/2022      Start: 1:12p     Stop: 1:43p     Time Spent: 31 min Location: In-person   Session narrative (presenting needs, interim history, self-report of stressors and symptoms, applications of prior therapy, status changes, and interventions made in session) Last med check June portrayed all stable and well with current meds, with rec to continue vitamins/supplements, and last therapy visit reported benefit of trazodone timing, though he had run out of B complex.    Today, says he feels like hibernating, with weather/season change.  Juliann Pulse got a new hip, recovered very well, albeit with swallowing problem postsurgical.  No concerns for ADHD.  No complaints of anxiety himself, but hypertension, on Losartan '50mg'$ .  Has [employer] daily vitals monitoring service, with daily BP, b.s., weight.  Trying to pay little attention to warnings about civil war and other politics.  Has hired out yard work.  Supplements are steady, meds steady.  Finally enjoying life a bit.  Still decided against getting a dog, feels he and [name] couldn't manage adequate care.  Able to keep up with housework.  16th anniversary coming in Feb.  Only complaint is forgetting things, STM, e.g., what he came in a room for.  Worse when he's trying to concentrate and Juliann Pulse calls out to add something, but says she's learning not to break his concentration.  Looking back at issues when he arrived at therapy 3 years ago, Jerilynn Mages is alive, 26, in touch, no outstanding issues.  OCD sxs of that time seem resolved, as well, neither he nor wife bothered.  Adjusted to life in [place], tried out the new casino once, lost $60, which was his planned limit, satisfied curiosity.  Resolved he would like to continue with distant checkins rather than go as-needed only.  Therapeutic  modalities: Cognitive Behavioral Therapy and Solution-Oriented/Positive Psychology  Mental Status/Observations:  Appearance:   Casual     Behavior:  Appropriate  Motor:  Normal  Speech/Language:   Clear and Coherent  Affect:  Appropriate  Mood:  normal  Thought process:  normal  Thought content:    WNL  Sensory/Perceptual disturbances:    WNL  Orientation:  Fully oriented  Attention:  Good    Concentration:  Fair  Memory:  grossly intact, with c/o STM flagging  Insight:    Fair  Judgment:   Good  Impulse Control:  Good   Risk Assessment: Danger to Self: No Self-injurious Behavior: No Danger to Others: No Physical Aggression / Violence: No Duty to Warn: No Access to Firearms a concern: No  Assessment of progress:  stabilized  Diagnosis:   ICD-10-CM   1. Recurrent major depressive disorder, in full remission ([place])  F33.42     2. Generalized anxiety disorder  F41.1     3. History of posttraumatic stress disorder (PTSD)  Z86.59     4. History of chemical exposure  Z87.898     5. 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 r/o mild cognitive impairment  R69      Plan:  Tend good health habits -- good supply of natural light, fresh air, sleep regulation, nutrition, enough activity Continue supplements as taking -- strong lineup of antioxidant, antiinflammatory, and neuroprotective elements See about improving activity level, for mood and BP Continue positive companionship with wife At  option, learn more from [place] about what she means by being hard on himself.  Option write down "hard on myself" thoughts, read them as if saying them to a son/brother/friend, decide whether he'd say it differently, and then say it that way to self At option, assess for sleep apnea, given fatigue, memory lapses, and nasal voice quality suggesting possible obstruction Other recommendations/advice as may be noted above Continue to utilize previously learned skills ad lib Maintain medication  as prescribed and work faithfully with relevant prescriber(s) if any changes are desired or seem indicated Call the clinic on-call service, 988/hotline, 911, or present to [facility] or ER if any life-threatening psychiatric crisis Return 3-6 mos at discretion. Already scheduled visit in this office 03/29/2022.  Blanchie Serve, PhD [name], PhD LP Clinical Psychologist, [facility] [address] [place], [place] [address] [phone]

## 2022-03-29 ENCOUNTER — Ambulatory Visit: Payer: Medicare PPO | Admitting: Physician Assistant

## 2022-04-07 ENCOUNTER — Ambulatory Visit: Payer: Medicare PPO | Admitting: Psychiatry

## 2022-04-21 ENCOUNTER — Other Ambulatory Visit: Payer: Self-pay | Admitting: Physician Assistant

## 2022-04-21 NOTE — Telephone Encounter (Signed)
Wife called at 10:25 because the pharmacy told her that the prescription for Garmon's alprazolam had expired so the refills are not good anymore.  Please send in new script as requested from the pharmacy. Appt 1/17

## 2022-04-21 NOTE — Telephone Encounter (Signed)
Filled 11/30

## 2022-05-10 ENCOUNTER — Encounter: Payer: Self-pay | Admitting: Physician Assistant

## 2022-05-10 ENCOUNTER — Ambulatory Visit (INDEPENDENT_AMBULATORY_CARE_PROVIDER_SITE_OTHER): Payer: Medicare PPO | Admitting: Physician Assistant

## 2022-05-10 DIAGNOSIS — G4700 Insomnia, unspecified: Secondary | ICD-10-CM | POA: Diagnosis not present

## 2022-05-10 DIAGNOSIS — F411 Generalized anxiety disorder: Secondary | ICD-10-CM

## 2022-05-10 DIAGNOSIS — F3342 Major depressive disorder, recurrent, in full remission: Secondary | ICD-10-CM | POA: Diagnosis not present

## 2022-05-10 MED ORDER — FLUOXETINE HCL 40 MG PO CAPS: 80.0000 mg | ORAL_CAPSULE | Freq: Every day | ORAL | 3 refills | Status: DC

## 2022-05-10 MED ORDER — ALPRAZOLAM 0.5 MG PO TABS: ORAL_TABLET | ORAL | 5 refills | Status: DC

## 2022-05-10 NOTE — Progress Notes (Signed)
[facility]  Patient ID: Grant Medina,  MRN: [id number]  PCP: Esperanza Sheets, FNP  Date of Evaluation: 05/10/2022 time spent:20 minutes  Chief Complaint:  Chief Complaint   Anxiety; Depression; Insomnia; Follow-up    HISTORY/CURRENT STATUS: HPI for f/u med check.   Doing really well. Patient is able to enjoy things. Because of the cold weather, he hasn't been doing much of anything. But once it warms up, he'll be back outside piddling in the yard or something.  Energy and motivation are good.  No extreme sadness, tearfulness, or feelings of hopelessness.  Sleeps well w/ Trazodone. ADLs and personal hygiene are normal.   Denies any changes in concentration, making decisions, or remembering things.  Appetite has not changed.  Weight is stable.   Denies suicidal or homicidal thoughts.  Anxiety is well-controlled. If he doesn't take the Xanax, he gets panicky, especially when around other people.   Patient denies increased energy with decreased need for sleep, increased talkativeness, racing thoughts, impulsivity or risky behaviors, increased spending, increased libido, grandiosity, increased irritability or anger, paranoia, or hallucinations.  Denies dizziness, syncope, seizures, numbness, tingling, tremor, tics, unsteady gait, falls, slurred speech, or confusion. Denies muscle or joint pain, stiffness, or dystonia.  Individual Medical History/ Review of Systems: Changes? :No    Past medications for mental health diagnoses include: Xanax, Prozac, Ambien, Effexor, Wellbutrin  Allergies: Patient has no known allergies.  Current Medications:  Current Outpatient Medications:    acetaminophen (TYLENOL) 500 MG tablet, Take 500 mg by mouth every 4 (four) hours as needed. , Disp: , Rfl:    B Complex-Biotin-FA (B-COMPLEX PO), Take 1 tablet by mouth daily., Disp: , Rfl:    gabapentin (NEURONTIN) 600 MG tablet, Take 600 mg by mouth 3 (three) times daily., Disp: , Rfl:     Insulin Degludec (TRESIBA FLEXTOUCH [place]), Inject 16 Units into the skin daily. 16 units daily, every 3rd day 20 units, Disp: , Rfl:    lipase/protease/amylase (CREON) 12000 units CPEP capsule, Take 36,000 Units by mouth 3 (three) times daily before meals., Disp: , Rfl:    losartan (COZAAR) 50 MG tablet, Take 50 mg by mouth 2 (two) times daily., Disp: , Rfl:    metFORMIN (GLUCOPHAGE) 1000 MG tablet, Take 1,000 mg by mouth 2 (two) times daily with a meal. , Disp: , Rfl:    methocarbamol (ROBAXIN) 500 MG tablet, Take 1 tablet (500 mg total) by mouth 2 (two) times daily., Disp: 20 tablet, Rfl: 0   Multiple Vitamin (DAILY VITE PO), Take 1 tablet by mouth daily., Disp: , Rfl:    Multiple Vitamins-Minerals (CENTRUM ADULTS PO), , Disp: , Rfl:    pioglitazone (ACTOS) 15 MG tablet, TK 1 T PO QD, Disp: , Rfl:    rivaroxaban (XARELTO) 20 MG TABS tablet, Take by mouth., Disp: , Rfl:    tamsulosin (FLOMAX) 0.4 MG CAPS capsule, , Disp: , Rfl:    traZODone (DESYREL) 100 MG tablet, Take 1-2 tablets (100-200 mg total) by mouth at bedtime., Disp: 180 tablet, Rfl: 3   Turmeric (QC TUMERIC COMPLEX PO), Take by mouth., Disp: , Rfl:    ALPRAZolam (XANAX) 0.5 MG tablet, TAKE 1/2 TO 1 TABLET BY MOUTH TWO TIMES A DAY AS NEEDED FOR ANXIETY, Disp: 60 tablet, Rfl: 5   FLUoxetine (PROZAC) 40 MG capsule, Take 2 capsules (80 mg total) by mouth daily., Disp: 180 capsule, Rfl: 3   lisinopril (PRINIVIL,ZESTRIL) 10 MG tablet, Take 10 mg by mouth daily.  (  Patient not taking: Reported on 12/01/2019), Disp: , Rfl:    naltrexone (DEPADE) 50 MG tablet, Take 50 mg by mouth daily. (Patient not taking: Reported on 12/01/2019), Disp: , Rfl:    polyethylene glycol powder (GLYCOLAX/MIRALAX) powder, , Disp: , Rfl:    warfarin (COUMADIN) 10 MG tablet, Take 10 mg by mouth one time only at 6 PM. Take '2mg'$  tablet on all days except for Thursday, Saturday, Sunday take '3mg'$  tablet. (Patient not taking: Reported on 12/01/2019), Disp: , Rfl:    warfarin  (COUMADIN) 2 MG tablet, , Disp: , Rfl:    warfarin (COUMADIN) 3 MG tablet, , Disp: , Rfl:  Medication Side Effects: none  Family Medical/ Social History: Changes? No   MENTAL HEALTH EXAM:  There were no vitals taken for this visit.There is no height or weight on file to calculate BMI.  General Appearance: Casual and Well Groomed  Eye Contact:  Good  Speech:  Clear and Coherent and Normal Rate  Volume:  Normal  Mood:  Euthymic  Affect:  Appropriate  Thought Process:  Goal Directed and Descriptions of Associations: Circumstantial  Orientation:  Full (Time, Place, and Person)  Thought Content: Logical   Suicidal Thoughts:  No  Homicidal Thoughts:  No  Memory:  WNL  Judgement:  Good  Insight:  Good  Psychomotor Activity:  Normal  Concentration:  Concentration: Good and Attention Span: Good  Recall:  Good  Fund of Knowledge: Good  Language: Good  Assets:  Desire for Improvement  ADL's:  Intact  Cognition: WNL  Prognosis:  Good   DIAGNOSES:    ICD-10-CM   1. Recurrent major depressive disorder, in full remission ([place])  F33.42     2. Generalized anxiety disorder  F41.1     3. Insomnia, unspecified type  G47.00       Receiving Psychotherapy: No   RECOMMENDATIONS:  PDMP was reviewed. Xanax filled 04/21/2022.  THC prescribed regularly. I provided 20 minutes of face to face time during this encounter, including time spent before and after the visit in records review, medical decision making, counseling pertinent to today's visit, and charting.   As far as his mental health goes, he's doing well so no change needed.  He understands that taking Xanax can cause confusion, dizziness, addiction, tolerance and is accepts.   Continue Xanax 0.5 mg, 1/2-1 p.o. twice daily as needed. Continue Prozac 40 mg, 2  p.o. daily. Continue gabapentin 600 mg, 1 p.o. 3 times daily. ([facility]) Continue trazodone 100 mg, 1-2. nightly as needed sleep. Continue B complex, multivitamin,  vitamin D, fish oil. Return in 6 months.  Donnal Moat, PA-C

## 2022-05-23 ENCOUNTER — Ambulatory Visit: Payer: Medicare PPO | Admitting: Psychiatry

## 2022-07-17 ENCOUNTER — Other Ambulatory Visit: Payer: Self-pay | Admitting: Physician Assistant

## 2022-11-08 ENCOUNTER — Ambulatory Visit: Payer: Medicare PPO | Admitting: Physician Assistant

## 2022-11-22 ENCOUNTER — Ambulatory Visit (INDEPENDENT_AMBULATORY_CARE_PROVIDER_SITE_OTHER): Payer: Medicare PPO | Admitting: Physician Assistant

## 2022-11-22 ENCOUNTER — Encounter: Payer: Self-pay | Admitting: Physician Assistant

## 2022-11-22 DIAGNOSIS — F411 Generalized anxiety disorder: Secondary | ICD-10-CM | POA: Diagnosis not present

## 2022-11-22 DIAGNOSIS — F3342 Major depressive disorder, recurrent, in full remission: Secondary | ICD-10-CM

## 2022-11-22 DIAGNOSIS — G4700 Insomnia, unspecified: Secondary | ICD-10-CM

## 2022-11-22 MED ORDER — ALPRAZOLAM 0.5 MG PO TABS: ORAL_TABLET | ORAL | 5 refills | Status: DC

## 2022-11-22 NOTE — Progress Notes (Signed)
[facility]  Patient ID: Grant Medina,  MRN: [id number]  PCP: Moody Bruins, FNP  Date of Evaluation: 11/22/2022 time spent:20 minutes  Chief Complaint:  Chief Complaint   Anxiety; Depression; Follow-up    HISTORY/CURRENT STATUS: HPI for f/u med check.   Patient is able to enjoy things.  Energy and motivation are good.  No extreme sadness, tearfulness, or feelings of hopelessness.  Sleeps ok.  ADLs and personal hygiene are normal.   Denies any changes in concentration, making decisions, or remembering things.  Appetite has not changed.  Weight is stable.  Anxiety is well controlled.  No PA. Denies suicidal or homicidal thoughts.  Patient denies increased energy with decreased need for sleep, increased talkativeness, racing thoughts, impulsivity or risky behaviors, increased spending, increased libido, grandiosity, increased irritability or anger, paranoia, or hallucinations.  Denies dizziness, syncope, seizures, numbness, tingling, tremor, tics, unsteady gait, falls, slurred speech, or confusion. Denies muscle or joint pain, stiffness, or dystonia.  Individual Medical History/ Review of Systems: Changes? :No    Past medications for mental health diagnoses include: Xanax, Prozac, Ambien, Effexor, Wellbutrin  Allergies: Patient has no known allergies.  Current Medications:  Current Outpatient Medications:    acetaminophen (TYLENOL) 500 MG tablet, Take 500 mg by mouth every 4 (four) hours as needed. , Disp: , Rfl:    B Complex-Biotin-FA (B-COMPLEX PO), Take 1 tablet by mouth daily., Disp: , Rfl:    FLUoxetine (PROZAC) 40 MG capsule, TAKE 2 CAPSULES EVERY DAY, Disp: 180 capsule, Rfl: 3   gabapentin (NEURONTIN) 600 MG tablet, Take 600 mg by mouth 3 (three) times daily., Disp: , Rfl:    Insulin Degludec (TRESIBA FLEXTOUCH [place]), Inject 16 Units into the skin daily. 16 units daily, every 3rd day 20 units, Disp: , Rfl:    lipase/protease/amylase (CREON) 12000 units  CPEP capsule, Take 36,000 Units by mouth 3 (three) times daily before meals., Disp: , Rfl:    losartan (COZAAR) 50 MG tablet, Take 50 mg by mouth 2 (two) times daily., Disp: , Rfl:    metFORMIN (GLUCOPHAGE) 1000 MG tablet, Take 1,000 mg by mouth 2 (two) times daily with a meal. , Disp: , Rfl:    methocarbamol (ROBAXIN) 500 MG tablet, Take 1 tablet (500 mg total) by mouth 2 (two) times daily., Disp: 20 tablet, Rfl: 0   Multiple Vitamin (DAILY VITE PO), Take 1 tablet by mouth daily., Disp: , Rfl:    Multiple Vitamins-Minerals (CENTRUM ADULTS PO), , Disp: , Rfl:    pioglitazone (ACTOS) 15 MG tablet, TK 1 T PO QD, Disp: , Rfl:    rivaroxaban (XARELTO) 20 MG TABS tablet, Take by mouth., Disp: , Rfl:    tamsulosin (FLOMAX) 0.4 MG CAPS capsule, , Disp: , Rfl:    traZODone (DESYREL) 100 MG tablet, TAKE 1 TO 2 TABLETS AT BEDTIME, Disp: 180 tablet, Rfl: 3   Turmeric (QC TUMERIC COMPLEX PO), Take by mouth., Disp: , Rfl:    ALPRAZolam (XANAX) 0.5 MG tablet, TAKE 1/2 TO 1 TABLET BY MOUTH TWO TIMES A DAY AS NEEDED FOR ANXIETY, Disp: 60 tablet, Rfl: 5   lisinopril (PRINIVIL,ZESTRIL) 10 MG tablet, Take 10 mg by mouth daily.  (Patient not taking: Reported on 12/01/2019), Disp: , Rfl:    naltrexone (DEPADE) 50 MG tablet, Take 50 mg by mouth daily. (Patient not taking: Reported on 12/01/2019), Disp: , Rfl:    polyethylene glycol powder (GLYCOLAX/MIRALAX) powder, , Disp: , Rfl:    warfarin (COUMADIN) 10 MG tablet,  Take 10 mg by mouth one time only at 6 PM. Take 2mg  tablet on all days except for Thursday, Saturday, Sunday take 3mg  tablet. (Patient not taking: Reported on 11/22/2022), Disp: , Rfl:    warfarin (COUMADIN) 2 MG tablet, , Disp: , Rfl:    warfarin (COUMADIN) 3 MG tablet, , Disp: , Rfl:  Medication Side Effects: none  Family Medical/ Social History: Changes? No  MENTAL HEALTH EXAM:  There were no vitals taken for this visit.There is no height or weight on file to calculate BMI.  General Appearance: Casual  and Well Groomed  Eye Contact:  Good  Speech:  Clear and Coherent and Normal Rate  Volume:  Normal  Mood:  Euthymic  Affect:  Appropriate  Thought Process:  Goal Directed and Descriptions of Associations: Circumstantial  Orientation:  Full (Time, Place, and Person)  Thought Content: Logical   Suicidal Thoughts:  No  Homicidal Thoughts:  No  Memory:  WNL  Judgement:  Good  Insight:  Good  Psychomotor Activity:  Normal  Concentration:  Concentration: Good and Attention Span: Good  Recall:  Good  Fund of Knowledge: Good  Language: Good  Assets:  Desire for Improvement Financial Resources/Insurance Housing Resilience Transportation  ADL's:  Intact  Cognition: WNL  Prognosis:  Good   DIAGNOSES:    ICD-10-CM   1. Generalized anxiety disorder  F41.1     2. Recurrent major depressive disorder, in full remission ([facility])  F33.42     3. Insomnia, unspecified type  G47.00      Receiving Psychotherapy: No   RECOMMENDATIONS:  PDMP was reviewed. Xanax filled 10/19/2022.  THC prescribed regularly. I provided 20 minutes of face to face time during this encounter, including time spent before and after the visit in records review, medical decision making, counseling pertinent to today's visit, and charting.   Doing well with his mental health medications so no changes will be made.  Continue Xanax 0.5 mg, 1/2-1 p.o. twice daily as needed. Continue Prozac 40 mg, 2  p.o. daily. Continue gabapentin 600 mg, 1 p.o. 3 times daily. ([facility]) Continue trazodone 100 mg, 1-2. nightly as needed sleep. Continue B complex, multivitamin, vitamin D, fish oil. Return in 6 months.  Melony Overly, PA-C

## 2023-05-05 ENCOUNTER — Other Ambulatory Visit: Payer: Self-pay | Admitting: Physician Assistant

## 2023-05-28 ENCOUNTER — Ambulatory Visit (INDEPENDENT_AMBULATORY_CARE_PROVIDER_SITE_OTHER): Payer: Medicare PPO | Admitting: Physician Assistant

## 2023-05-28 ENCOUNTER — Encounter: Payer: Self-pay | Admitting: Physician Assistant

## 2023-05-28 DIAGNOSIS — F411 Generalized anxiety disorder: Secondary | ICD-10-CM | POA: Diagnosis not present

## 2023-05-28 DIAGNOSIS — G4700 Insomnia, unspecified: Secondary | ICD-10-CM | POA: Diagnosis not present

## 2023-05-28 DIAGNOSIS — F3342 Major depressive disorder, recurrent, in full remission: Secondary | ICD-10-CM | POA: Diagnosis not present

## 2023-05-28 MED ORDER — ALPRAZOLAM 0.5 MG PO TABS: ORAL_TABLET | ORAL | 5 refills | Status: DC

## 2023-05-28 NOTE — Progress Notes (Signed)
[facility]  Patient ID: Grant Medina,  MRN: [id number]  PCP: Moody Bruins, FNP  Date of Evaluation: 05/28/2023 time spent:20 minutes  Chief Complaint:  Chief Complaint   Depression; Anxiety; Insomnia; Follow-up    HISTORY/CURRENT STATUS: HPI for f/u med check.   Feels good mentally.  Patient is able to enjoy things.  Energy and motivation are good.  No extreme sadness, tearfulness, or feelings of hopelessness.  Sleeps well most of the time.  Trazodone helped when needed.  ADLs and personal hygiene are normal.   Denies any changes in concentration, making decisions, or remembering things.  Appetite has not changed.  Weight is stable.  Anxiety is well-controlled.  Xanax helps when needed.  Denies suicidal or homicidal thoughts.  Patient denies increased energy with decreased need for sleep, increased talkativeness, racing thoughts, impulsivity or risky behaviors, increased spending, increased libido, grandiosity, increased irritability or anger, paranoia, or hallucinations.  Denies dizziness, syncope, seizures, numbness, tingling, tremor, tics, unsteady gait, falls, slurred speech, or confusion. Denies muscle or joint pain, stiffness, or dystonia.  Individual Medical History/ Review of Systems: Changes? :No    Past medications for mental health diagnoses include: Xanax, Prozac, Ambien, Effexor, Wellbutrin  Allergies: Patient has no known allergies.  Current Medications:  Current Outpatient Medications:    acetaminophen (TYLENOL) 500 MG tablet, Take 500 mg by mouth every 4 (four) hours as needed. , Disp: , Rfl:    ALPRAZolam (XANAX) 0.5 MG tablet, TAKE 1/2 TO 1 TABLET BY MOUTH TWO TIMES A DAY AS NEEDED FOR ANXIETY, Disp: 60 tablet, Rfl: 5   B Complex-Biotin-FA (B-COMPLEX PO), Take 1 tablet by mouth daily., Disp: , Rfl:    FLUoxetine (PROZAC) 40 MG capsule, TAKE 2 CAPSULES EVERY DAY, Disp: 180 capsule, Rfl: 1   gabapentin (NEURONTIN) 600 MG tablet, Take 600 mg by  mouth 3 (three) times daily., Disp: , Rfl:    Insulin Degludec (TRESIBA FLEXTOUCH [place]), Inject 16 Units into the skin daily. 16 units daily, every 3rd day 20 units, Disp: , Rfl:    lipase/protease/amylase (CREON) 12000 units CPEP capsule, Take 36,000 Units by mouth 3 (three) times daily before meals., Disp: , Rfl:    lisinopril (PRINIVIL,ZESTRIL) 10 MG tablet, Take 10 mg by mouth daily.  (Patient not taking: Reported on 05/28/2023), Disp: , Rfl:    losartan (COZAAR) 50 MG tablet, Take 50 mg by mouth 2 (two) times daily., Disp: , Rfl:    metFORMIN (GLUCOPHAGE) 1000 MG tablet, Take 1,000 mg by mouth 2 (two) times daily with a meal. , Disp: , Rfl:    methocarbamol (ROBAXIN) 500 MG tablet, Take 1 tablet (500 mg total) by mouth 2 (two) times daily., Disp: 20 tablet, Rfl: 0   Multiple Vitamin (DAILY VITE PO), Take 1 tablet by mouth daily., Disp: , Rfl:    Multiple Vitamins-Minerals (CENTRUM ADULTS PO), , Disp: , Rfl:    naltrexone (DEPADE) 50 MG tablet, Take 50 mg by mouth daily. (Patient not taking: Reported on 12/01/2019), Disp: , Rfl:    pioglitazone (ACTOS) 15 MG tablet, TK 1 T PO QD, Disp: , Rfl:    polyethylene glycol powder (GLYCOLAX/MIRALAX) powder, , Disp: , Rfl:    rivaroxaban (XARELTO) 20 MG TABS tablet, Take by mouth., Disp: , Rfl:    tamsulosin (FLOMAX) 0.4 MG CAPS capsule, , Disp: , Rfl:    traZODone (DESYREL) 100 MG tablet, TAKE 1 TO 2 TABLETS AT BEDTIME, Disp: 180 tablet, Rfl: 1   Turmeric (QC TUMERIC  COMPLEX PO), Take by mouth., Disp: , Rfl:    warfarin (COUMADIN) 10 MG tablet, Take 10 mg by mouth one time only at 6 PM. Take 2mg  tablet on all days except for Thursday, Saturday, Sunday take 3mg  tablet. (Patient not taking: Reported on 05/28/2023), Disp: , Rfl:    warfarin (COUMADIN) 2 MG tablet, , Disp: , Rfl:    warfarin (COUMADIN) 3 MG tablet, , Disp: , Rfl:  Medication Side Effects: none  Family Medical/ Social History: Changes? No  MENTAL HEALTH EXAM:  There were no vitals taken for  this visit.There is no height or weight on file to calculate BMI.  General Appearance: Casual and Well Groomed  Eye Contact:  Good  Speech:  Clear and Coherent and Normal Rate  Volume:  Normal  Mood:  Euthymic  Affect:  Appropriate  Thought Process:  Goal Directed and Descriptions of Associations: Circumstantial  Orientation:  Full (Time, Place, and Person)  Thought Content: Logical   Suicidal Thoughts:  No  Homicidal Thoughts:  No  Memory:  WNL  Judgement:  Good  Insight:  Good  Psychomotor Activity:  Normal  Concentration:  Concentration: Good and Attention Span: Good  Recall:  Good  Fund of Knowledge: Good  Language: Good  Assets:  Communication Skills Desire for Improvement Financial Resources/Insurance Housing Resilience Transportation  ADL's:  Intact  Cognition: WNL  Prognosis:  Good   DIAGNOSES:    ICD-10-CM   1. Recurrent major depressive disorder, in full remission (HCC)  F33.42     2. Generalized anxiety disorder  F41.1     3. Insomnia, unspecified type  G47.00       Receiving Psychotherapy: No   RECOMMENDATIONS:  PDMP was reviewed. Xanax filled 04/23/2023.  THC prescribed regularly. I provided 20 minutes of face to face time during this encounter, including time spent before and after the visit in records review, medical decision making, counseling pertinent to today's visit, and charting.   He's doing well so no changes.  Continue Xanax 0.5 mg, 1/2-1 p.o. twice daily as needed. Continue Prozac 40 mg, 2  p.o. daily. Continue gabapentin 600 mg, 1 p.o. 3 times daily. [facility]) Continue trazodone 100 mg, 1-2. nightly as needed sleep. Continue B complex, multivitamin, vitamin D, fish oil. Return in 6 months.  Melony Overly, PA-C

## 2023-09-30 ENCOUNTER — Other Ambulatory Visit: Payer: Self-pay | Admitting: Physician Assistant

## 2023-11-27 ENCOUNTER — Ambulatory Visit (INDEPENDENT_AMBULATORY_CARE_PROVIDER_SITE_OTHER): Payer: Medicare PPO | Admitting: Physician Assistant

## 2023-11-27 ENCOUNTER — Encounter: Payer: Self-pay | Admitting: Physician Assistant

## 2023-11-27 DIAGNOSIS — Z8659 Personal history of other mental and behavioral disorders: Secondary | ICD-10-CM

## 2023-11-27 DIAGNOSIS — G4700 Insomnia, unspecified: Secondary | ICD-10-CM | POA: Diagnosis not present

## 2023-11-27 DIAGNOSIS — F411 Generalized anxiety disorder: Secondary | ICD-10-CM

## 2023-11-27 DIAGNOSIS — F3342 Major depressive disorder, recurrent, in full remission: Secondary | ICD-10-CM

## 2023-11-27 MED ORDER — FLUOXETINE HCL 40 MG PO CAPS: 80.0000 mg | ORAL_CAPSULE | Freq: Every day | ORAL | 1 refills | Status: DC

## 2023-11-27 MED ORDER — ALPRAZOLAM 0.5 MG PO TABS: ORAL_TABLET | ORAL | 5 refills | Status: DC

## 2023-11-27 MED ORDER — TRAZODONE HCL 100 MG PO TABS: 100.0000 mg | ORAL_TABLET | Freq: Every evening | ORAL | 1 refills | Status: DC | PRN

## 2023-11-27 NOTE — Progress Notes (Signed)
 [facility]  Patient ID: Grant Medina,  MRN: [id number]  PCP: Bernabe Irene Salm, FNP  Date of Evaluation: 11/27/2023 Time spent:20 minutes  Chief Complaint:  Chief Complaint   Anxiety; Depression; Insomnia; Follow-up    HISTORY/CURRENT STATUS: HPI for f/u med check.   Grant Medina is doing well.  Meds are still effective.  He works around the yard and house, Academic librarian for pleasure.  It's been too hot to do much.  No extreme sadness, tearfulness, or feelings of hopelessness.  Sleeps well with the Trazodone  Rx by the [facility]. Hygiene is normal.  No change in memory.  Appetite has not changed.  Weight is stable.  No mania, delirium, AH/VH.  No SI/HI.  Anxiety is well controlled. Takes the Xanax  when needed, usually bid or else he gets panicky.  He's able to relax and not get uptight about things.   Individual Medical History/ Review of Systems: Changes? :No    Past medications for mental health diagnoses include: Xanax , Prozac , Ambien, Effexor, Wellbutrin  Allergies: Patient has no known allergies.  Current Medications:  Current Outpatient Medications:    acetaminophen  (TYLENOL ) 500 MG tablet, Take 500 mg by mouth every 4 (four) hours as needed. , Disp: , Rfl:    B Complex-Biotin-FA (B-COMPLEX PO), Take 1 tablet by mouth daily., Disp: , Rfl:    gabapentin (NEURONTIN) 600 MG tablet, Take 600 mg by mouth 3 (three) times daily., Disp: , Rfl:    Insulin Degludec (TRESIBA FLEXTOUCH [place]), Inject 16 Units into the skin daily. 16 units daily, every 3rd day 20 units (Patient taking differently: Inject 8 Units into the skin daily. 16 units daily, every 3rd day 20 units), Disp: , Rfl:    lipase/protease/amylase (CREON) 12000 units CPEP capsule, Take 36,000 Units by mouth 3 (three) times daily before meals., Disp: , Rfl:    losartan (COZAAR) 50 MG tablet, Take 50 mg by mouth 2 (two) times daily., Disp: , Rfl:    metFORMIN (GLUCOPHAGE) 1000 MG tablet, Take 1,000 mg by mouth 2 (two) times daily  with a meal. , Disp: , Rfl:    Multiple Vitamin (DAILY VITE PO), Take 1 tablet by mouth daily., Disp: , Rfl:    rivaroxaban (XARELTO) 20 MG TABS tablet, Take by mouth., Disp: , Rfl:    tamsulosin (FLOMAX) 0.4 MG CAPS capsule, , Disp: , Rfl:    ALPRAZolam  (XANAX ) 0.5 MG tablet, TAKE 1/2 TO 1 TABLET BY MOUTH TWO TIMES A DAY AS NEEDED FOR ANXIETY, Disp: 60 tablet, Rfl: 5   FLUoxetine  (PROZAC ) 40 MG capsule, Take 2 capsules (80 mg total) by mouth daily., Disp: 180 capsule, Rfl: 1   lisinopril (PRINIVIL,ZESTRIL) 10 MG tablet, Take 10 mg by mouth daily.  (Patient not taking: Reported on 05/28/2023), Disp: , Rfl:    methocarbamol  (ROBAXIN ) 500 MG tablet, Take 1 tablet (500 mg total) by mouth 2 (two) times daily., Disp: 20 tablet, Rfl: 0   Multiple Vitamins-Minerals (CENTRUM ADULTS PO), , Disp: , Rfl:    naltrexone (DEPADE) 50 MG tablet, Take 50 mg by mouth daily. (Patient not taking: Reported on 12/01/2019), Disp: , Rfl:    pioglitazone (ACTOS) 15 MG tablet, TK 1 T PO QD, Disp: , Rfl:    polyethylene glycol powder (GLYCOLAX/MIRALAX) powder, , Disp: , Rfl:    traZODone  (DESYREL ) 100 MG tablet, Take 1-2 tablets (100-200 mg total) by mouth at bedtime as needed for sleep., Disp: 180 tablet, Rfl: 1   Turmeric (QC TUMERIC COMPLEX PO), Take  by mouth., Disp: , Rfl:    warfarin (COUMADIN) 10 MG tablet, Take 10 mg by mouth one time only at 6 PM. Take 2mg  tablet on all days except for Thursday, Saturday, Sunday take 3mg  tablet. (Patient not taking: Reported on 05/28/2023), Disp: , Rfl:    warfarin (COUMADIN) 2 MG tablet, , Disp: , Rfl:    warfarin (COUMADIN) 3 MG tablet, , Disp: , Rfl:  Medication Side Effects: none  Family Medical/ Social History: Changes? No  MENTAL HEALTH EXAM:  There were no vitals taken for this visit.There is no height or weight on file to calculate BMI.  General Appearance: Casual and Well Groomed  Eye Contact:  Good  Speech:  Clear and Coherent and Normal Rate  Volume:  Normal  Mood:   Euthymic  Affect:  Appropriate  Thought Process:  Goal Directed and Descriptions of Associations: Circumstantial  Orientation:  Full (Time, Place, and Person)  Thought Content: Logical   Suicidal Thoughts:  No  Homicidal Thoughts:  No  Memory:  WNL  Judgement:  Good  Insight:  Good  Psychomotor Activity:  Normal  Concentration:  Concentration: Good and Attention Span: Good  Recall:  Good  Fund of Knowledge: Good  Language: Good  Assets:  Communication Skills Desire for Improvement Financial Resources/Insurance Housing Resilience Social Support Transportation  ADL's:  Intact  Cognition: WNL  Prognosis:  Good   DIAGNOSES:    ICD-10-CM   1. Recurrent major depressive disorder, in full remission (HCC)  F33.42     2. Generalized anxiety disorder  F41.1     3. Insomnia, unspecified type  G47.00     4. History of posttraumatic stress disorder (PTSD)  Z86.59      Receiving Psychotherapy: No   RECOMMENDATIONS:  PDMP was reviewed. Xanax  filled 10/19/2023.  THC prescribed regularly. I provided approximately 20 minutes of face to face time during this encounter, including time spent before and after the visit in records review, medical decision making, counseling pertinent to today's visit, and charting.   He's doing well with current meds so no changes are needed.   Continue Xanax  0.5 mg, 1/2-1 p.o. twice daily as needed. Continue Prozac  40 mg, 2  p.o. daily. Continue gabapentin 600 mg, 1 p.o. 3 times daily. ([facility]) Continue trazodone  100 mg, 1-2. nightly as needed sleep. Continue B complex, multivitamin, vitamin D, fish oil. Return in 6 months.  Verneita Cooks, PA-C

## 2023-12-14 ENCOUNTER — Other Ambulatory Visit: Payer: Self-pay | Admitting: Physician Assistant

## 2024-05-11 ENCOUNTER — Other Ambulatory Visit: Payer: Self-pay | Admitting: Physician Assistant

## 2024-05-29 ENCOUNTER — Encounter: Payer: Self-pay | Admitting: Physician Assistant

## 2024-05-29 ENCOUNTER — Ambulatory Visit: Admitting: Physician Assistant

## 2024-05-29 DIAGNOSIS — G4700 Insomnia, unspecified: Secondary | ICD-10-CM

## 2024-05-29 DIAGNOSIS — F411 Generalized anxiety disorder: Secondary | ICD-10-CM

## 2024-05-29 DIAGNOSIS — F3342 Major depressive disorder, recurrent, in full remission: Secondary | ICD-10-CM

## 2024-05-29 MED ORDER — ALPRAZOLAM 0.5 MG PO TABS: ORAL_TABLET | ORAL | 5 refills | Status: AC

## 2024-05-29 NOTE — Progress Notes (Signed)
 "     [facility]  Patient ID: Grant Medina,  MRN: [id number]  PCP: Bernabe Irene Salm, FNP  Date of Evaluation: 05/29/2024 Time spent:20 minutes  Chief Complaint:  Chief Complaint   Anxiety; Depression; Follow-up    HISTORY/CURRENT STATUS: HPI for f/u med check. Accompanied by his wife.   He's seeing someone at the [facility] for memory issues, had what sounds like neuropsychological testing and hearing testing, results are pending. Sleeps well with the Trazodone .  Energy and motivation are stable.  No extreme sadness, tearfulness, or feelings of hopelessness. ADLs and personal hygiene are normal.  Appetite has not changed.  Anxiety is controlled.  The Xanax  is helpful.  No confusion or falls. No mania, delirium, AH/VH.  No SI/HI.  Individual Medical History/ Review of Systems: Changes? :No    Past medications for mental health diagnoses include: Xanax , Prozac , Ambien, Effexor, Wellbutrin  Allergies: Patient has no known allergies.  Current Medications:  Current Outpatient Medications:    acetaminophen  (TYLENOL ) 500 MG tablet, Take 500 mg by mouth every 4 (four) hours as needed. , Disp: , Rfl:    B Complex-Biotin-FA (B-COMPLEX PO), Take 1 tablet by mouth daily., Disp: , Rfl:    FLUoxetine  (PROZAC ) 40 MG capsule, TAKE 2 CAPSULES EVERY DAY, Disp: 180 capsule, Rfl: 1   gabapentin (NEURONTIN) 600 MG tablet, Take 600 mg by mouth 3 (three) times daily., Disp: , Rfl:    lipase/protease/amylase (CREON) 12000 units CPEP capsule, Take 36,000 Units by mouth 3 (three) times daily before meals., Disp: , Rfl:    losartan (COZAAR) 50 MG tablet, Take 50 mg by mouth 2 (two) times daily., Disp: , Rfl:    metFORMIN (GLUCOPHAGE) 1000 MG tablet, Take 1,000 mg by mouth 2 (two) times daily with a meal. , Disp: , Rfl:    methocarbamol  (ROBAXIN ) 500 MG tablet, Take 1 tablet (500 mg total) by mouth 2 (two) times daily., Disp: 20 tablet, Rfl: 0   Multiple Vitamin (DAILY VITE PO), Take 1 tablet by mouth  daily., Disp: , Rfl:    Multiple Vitamins-Minerals (CENTRUM ADULTS PO), , Disp: , Rfl:    pioglitazone (ACTOS) 15 MG tablet, TK 1 T PO QD, Disp: , Rfl:    rivaroxaban (XARELTO) 20 MG TABS tablet, Take by mouth., Disp: , Rfl:    tamsulosin (FLOMAX) 0.4 MG CAPS capsule, , Disp: , Rfl:    traZODone  (DESYREL ) 100 MG tablet, TAKE 1 TO 2 TABLETS AT BEDTIME, Disp: 180 tablet, Rfl: 1   Turmeric (QC TUMERIC COMPLEX PO), Take by mouth., Disp: , Rfl:    ALPRAZolam  (XANAX ) 0.5 MG tablet, TAKE 1/2 TO 1 TABLET BY MOUTH TWO TIMES A DAY AS NEEDED FOR ANXIETY, Disp: 60 tablet, Rfl: 5   Insulin Degludec (TRESIBA FLEXTOUCH [place]), Inject 16 Units into the skin daily. 16 units daily, every 3rd day 20 units (Patient taking differently: Inject 8 Units into the skin daily. 16 units daily, every 3rd day 20 units), Disp: , Rfl:    lisinopril (PRINIVIL,ZESTRIL) 10 MG tablet, Take 10 mg by mouth daily.  (Patient not taking: Reported on 05/28/2023), Disp: , Rfl:    naltrexone (DEPADE) 50 MG tablet, Take 50 mg by mouth daily. (Patient not taking: Reported on 12/01/2019), Disp: , Rfl:    polyethylene glycol powder (GLYCOLAX/MIRALAX) powder, , Disp: , Rfl:    warfarin (COUMADIN) 10 MG tablet, Take 10 mg by mouth one time only at 6 PM. Take 2mg  tablet on all days except for  Thursday, Saturday, Sunday take 3mg  tablet. (Patient not taking: Reported on 05/28/2023), Disp: , Rfl:    warfarin (COUMADIN) 2 MG tablet, , Disp: , Rfl:    warfarin (COUMADIN) 3 MG tablet, , Disp: , Rfl:  Medication Side Effects: none  Family Medical/ Social History: Changes? No  MENTAL HEALTH EXAM:  There were no vitals taken for this visit.There is no height or weight on file to calculate BMI.  General Appearance: Casual and Well Groomed  Eye Contact:  Good  Speech:  Clear and Coherent and Normal Rate  Volume:  Normal  Mood:  Euthymic  Affect:  Appropriate  Thought Process:  Goal Directed and Descriptions of Associations: Circumstantial  Orientation:   Full (Time, Place, and Person)  Thought Content: Logical   Suicidal Thoughts:  No  Homicidal Thoughts:  No  Memory:  WNL  Judgement:  Good  Insight:  Good  Psychomotor Activity:  Normal  Concentration:  Concentration: Good and Attention Span: Good  Recall:  Good  Fund of Knowledge: Good  Language: Good  Assets:  Communication Skills Desire for Improvement Financial Resources/Insurance Housing Resilience Social Support Transportation  ADL's:  Intact  Cognition: WNL  Prognosis:  Good   DIAGNOSES:    ICD-10-CM   1. Recurrent major depressive disorder, in full remission  F33.42     2. Generalized anxiety disorder  F41.1     3. Insomnia, unspecified type  G47.00      Receiving Psychotherapy: No   RECOMMENDATIONS:  PDMP was reviewed. Xanax  filled 04/25/2024.  THC prescribed 05/27/2024, prescribed regularly. I provided approximately  20 minutes of face to face time during this encounter, including time spent before and after the visit in records review, medical decision making, counseling pertinent to today's visit, and charting.   He's doing well so no changes are needed.   Continue Xanax  0.5 mg, 1/2-1 p.o. twice daily as needed. Continue Prozac  40 mg, 2  p.o. daily. Continue gabapentin 600 mg, 1 p.o. 3 times daily. ([facility]) Continue trazodone  100 mg, 1-2. nightly as needed sleep. Continue B complex, multivitamin, vitamin D, fish oil. Return in 6 months.  Verneita Cooks, PA-C  "

## 2024-11-24 ENCOUNTER — Ambulatory Visit: Admitting: Physician Assistant
